# Patient Record
Sex: Female | Born: 1990 | Race: Black or African American | Hispanic: No | Marital: Single | Smoking: Never smoker
Health system: Southern US, Community
[De-identification: ages and names within clinical notes are randomized; demographics above are authoritative.]

## PROBLEM LIST (undated history)

## (undated) DIAGNOSIS — M546 Pain in thoracic spine: Secondary | ICD-10-CM

## (undated) DIAGNOSIS — F418 Other specified anxiety disorders: Secondary | ICD-10-CM

## (undated) DIAGNOSIS — E669 Obesity, unspecified: Secondary | ICD-10-CM

## (undated) DIAGNOSIS — Z3041 Encounter for surveillance of contraceptive pills: Secondary | ICD-10-CM

## (undated) DIAGNOSIS — E559 Vitamin D deficiency, unspecified: Secondary | ICD-10-CM

## (undated) DIAGNOSIS — T783XXA Angioneurotic edema, initial encounter: Secondary | ICD-10-CM

## (undated) DIAGNOSIS — R739 Hyperglycemia, unspecified: Secondary | ICD-10-CM

## (undated) DIAGNOSIS — L509 Urticaria, unspecified: Secondary | ICD-10-CM

## (undated) DIAGNOSIS — E781 Pure hyperglyceridemia: Secondary | ICD-10-CM

## (undated) DIAGNOSIS — K760 Fatty (change of) liver, not elsewhere classified: Secondary | ICD-10-CM

## (undated) DIAGNOSIS — D72829 Elevated white blood cell count, unspecified: Secondary | ICD-10-CM

## (undated) HISTORY — PX: WISDOM TOOTH EXTRACTION: SHX21

## (undated) HISTORY — DX: Angioneurotic edema, initial encounter: T78.3XXA

## (undated) HISTORY — DX: Hyperglycemia, unspecified: R73.9

## (undated) HISTORY — DX: Urticaria, unspecified: L50.9

## (undated) HISTORY — DX: Pain in thoracic spine: M54.6

## (undated) HISTORY — DX: Other specified anxiety disorders: F41.8

## (undated) HISTORY — DX: Fatty (change of) liver, not elsewhere classified: K76.0

## (undated) HISTORY — DX: Obesity, unspecified: E66.9

## (undated) HISTORY — DX: Elevated white blood cell count, unspecified: D72.829

## (undated) HISTORY — DX: Vitamin D deficiency, unspecified: E55.9

## (undated) HISTORY — DX: Pure hyperglyceridemia: E78.1

## (undated) HISTORY — DX: Encounter for surveillance of contraceptive pills: Z30.41

---
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
## 2011-07-18 ENCOUNTER — Emergency Department: Payer: Self-pay | Admitting: Unknown Physician Specialty

## 2011-08-02 ENCOUNTER — Ambulatory Visit: Payer: Self-pay | Admitting: General Surgery

## 2011-08-02 HISTORY — PX: CHOLECYSTECTOMY: SHX55

## 2012-04-26 IMAGING — US ABDOMEN ULTRASOUND
1 series · 17 of 25 positions shown · non-contrast
Comparison: none

REASON FOR EXAM: RUQ and epigastric pain for 5 days.  worse with food
COMMENTS:   LMP: Negative Beta HCG

[Series 1: abdomen ultrasound · 17 of 71 slices shown]
[im 1/71]
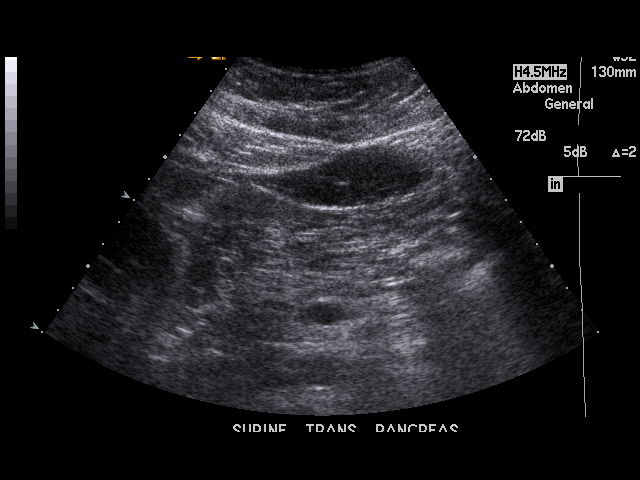
[im 6/71]
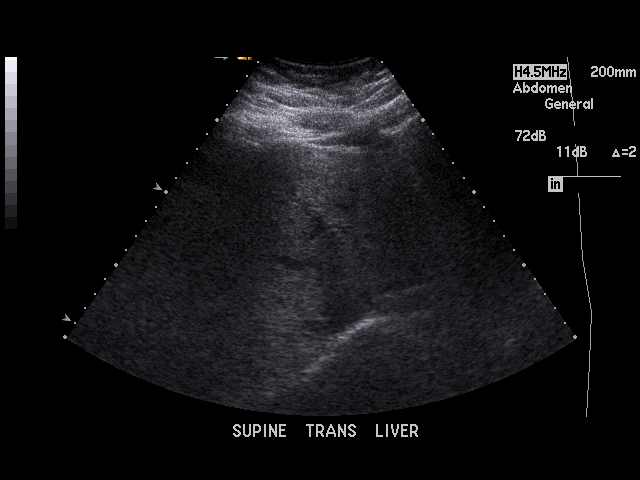
[im 9/71]
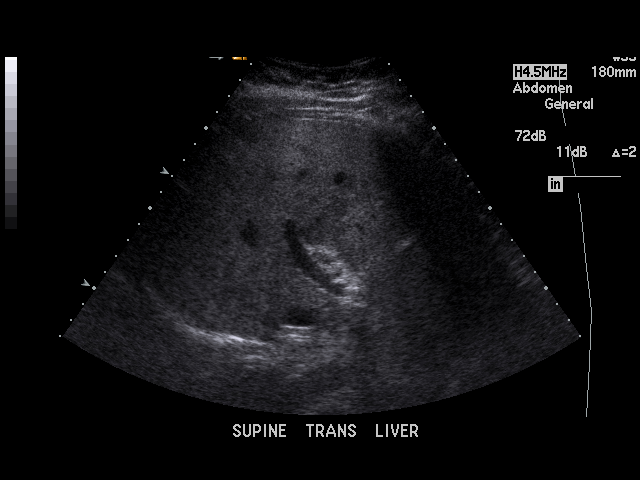
[im 15/71]
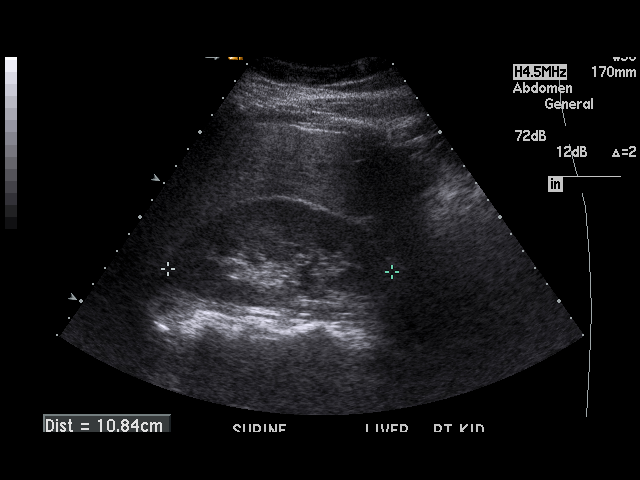
[im 18/71]
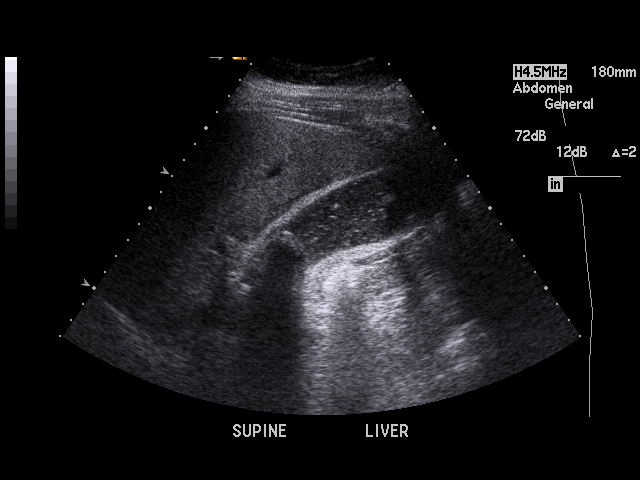
[im 24/71]
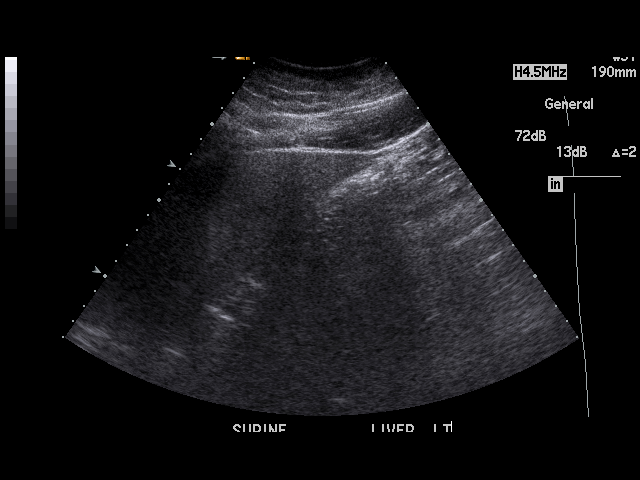
[im 27/71]
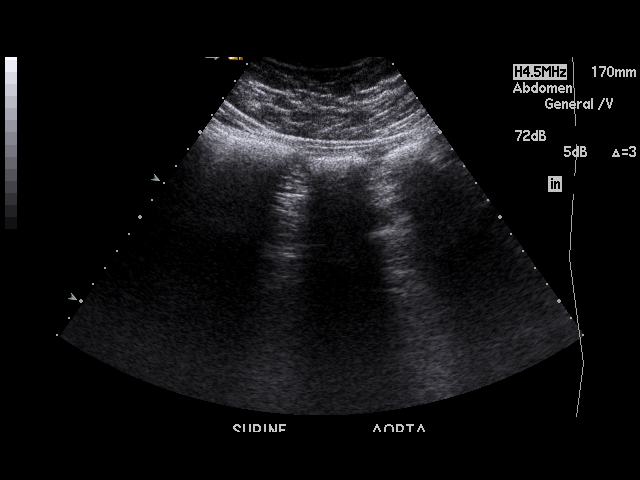
[im 33/71]
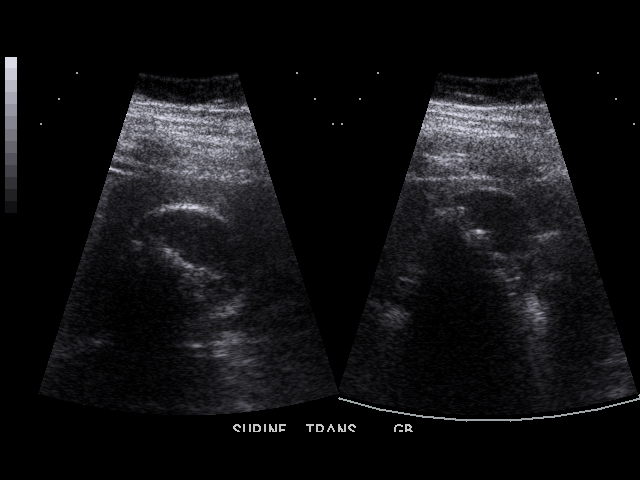
[im 36/71]
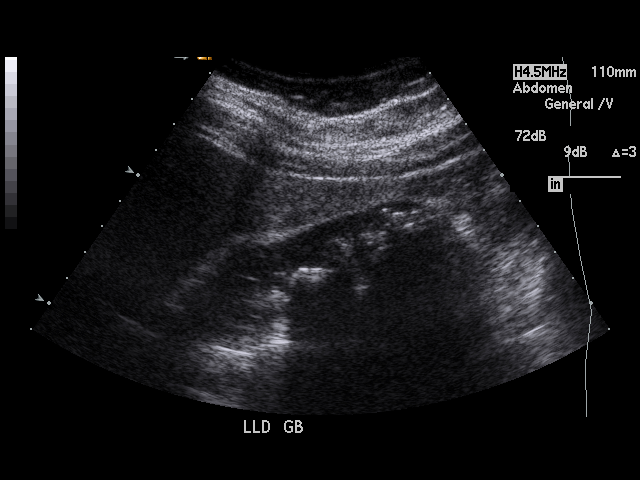
[im 38/71]
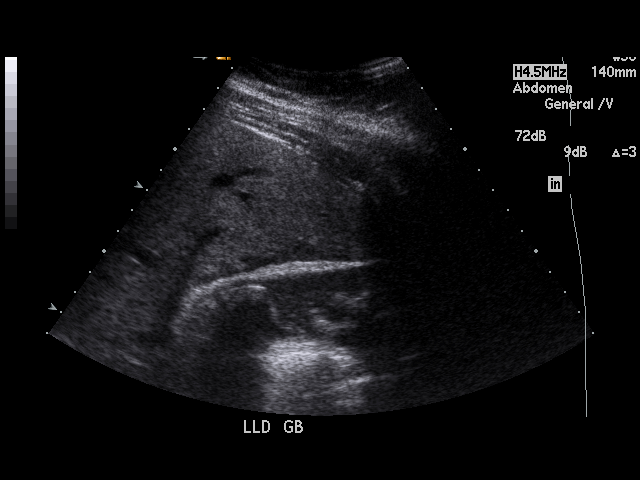
[im 44/71]
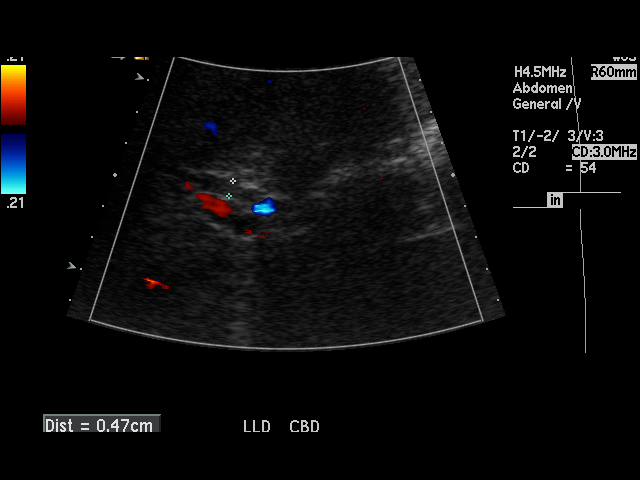
[im 47/71]
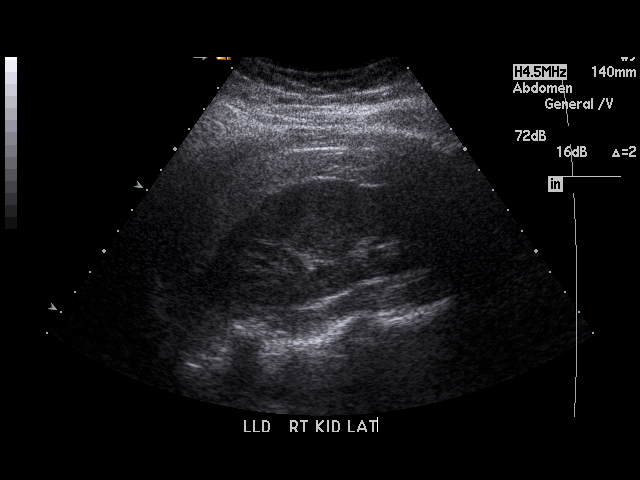
[im 53/71]
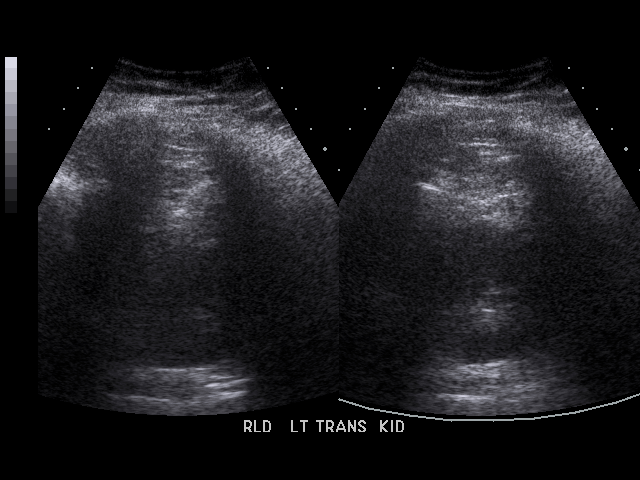
[im 56/71]
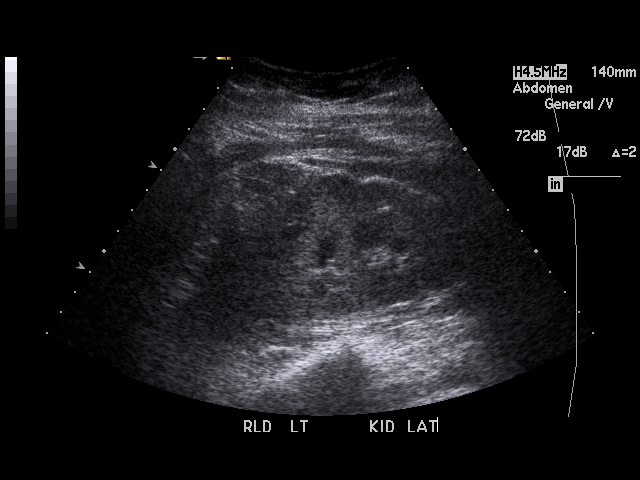
[im 62/71]
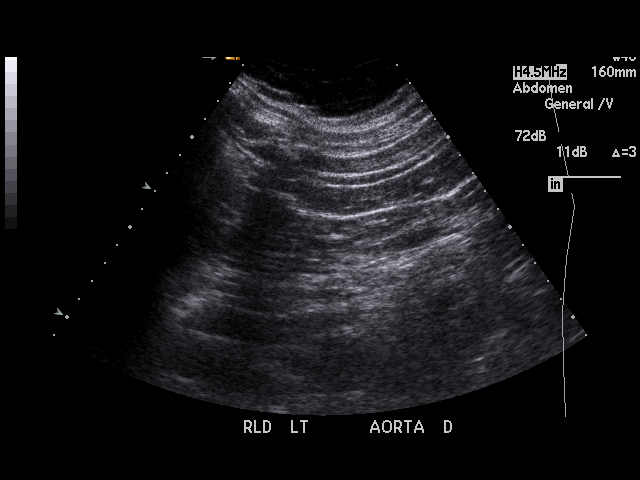
[im 65/71]
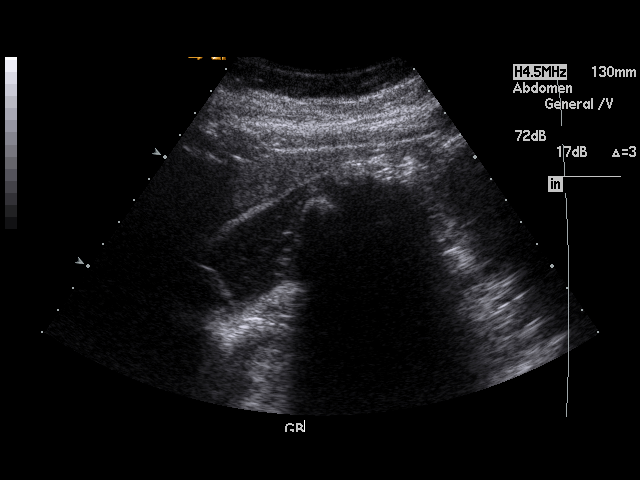
[im 71/71]
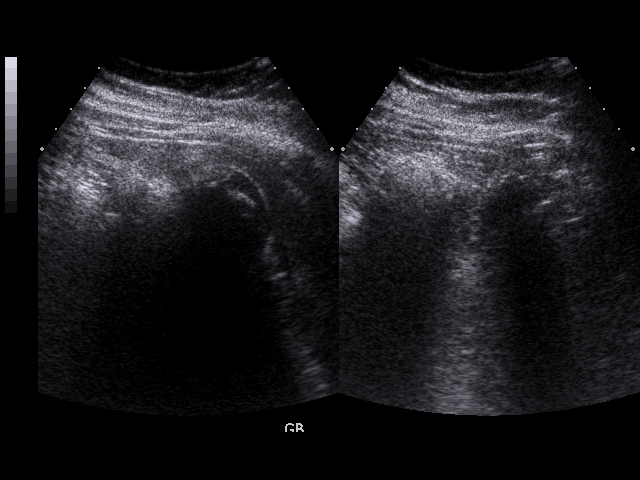

[17 of 25 positions shown; findings below may reference images not displayed]

PROCEDURE:     US  - US ABDOMEN GENERAL SURVEY  - July 18, 2011 [DATE]

RESULT:     The liver appears mildly hyperechogenic, suspicious for fatty
infiltration. The spleen size is normal. The abdominal aorta and inferior
vena cava show no significant abnormalities. Portions of the pancreatic head
and tail are obscured. The body of the pancreas appears normal
sonographically. There are echo densities in the gallbladder compatible with
gallstones. One of the stones measures 2.5 cm in diameter. There is
thickening of the gallbladder wall which measures 6.3 mm in thickness. No
definite pericholecystic fluid is seen. The kidneys show no hydronephrosis.
There is no ascites.
IMPRESSION: 1.  Cholelithiasis with there being thickening of the gallbladder wall
suspicious for cholecystitis.
2.  Possible fatty infiltration of the liver.
3.  No ascites is seen.

## 2013-05-08 ENCOUNTER — Emergency Department: Payer: Self-pay | Admitting: Emergency Medicine

## 2013-05-16 ENCOUNTER — Emergency Department: Payer: Self-pay | Admitting: Emergency Medicine

## 2013-07-09 LAB — HM PAP SMEAR: HM Pap smear: NORMAL

## 2014-01-02 LAB — HEMOGLOBIN A1C: HEMOGLOBIN A1C: 5.3
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
## 2014-05-28 HISTORY — PX: BREAST ENHANCEMENT SURGERY: SHX7

## 2014-07-10 ENCOUNTER — Emergency Department: Payer: Self-pay | Admitting: Emergency Medicine

## 2014-07-10 LAB — CK TOTAL AND CKMB (NOT AT ARMC): CK, Total: 62 U/L

## 2014-07-10 LAB — TROPONIN I: Troponin-I: <0.02 ng/mL

## 2014-07-10 LAB — CBC
HCT: 31.6 % — ABNORMAL LOW (ref 35.0–47.0)
HGB: 10.7 g/dL — AB (ref 12.0–16.0)
MCH: 32.2 pg (ref 26.0–34.0)
MCHC: 33.9 g/dL (ref 32.0–36.0)
MCV: 95 fL (ref 80–100)
PLATELETS: 249 10*3/uL (ref 150–440)
RBC: 3.32 10*6/uL — ABNORMAL LOW (ref 3.80–5.20)
RDW: 12.2 % (ref 11.5–14.5)
WBC: 14.2 10*3/uL — ABNORMAL HIGH (ref 3.6–11.0)

## 2014-07-10 LAB — URINALYSIS, COMPLETE
BILIRUBIN, UR: NEGATIVE
GLUCOSE, UR: NEGATIVE mg/dL (ref 0–75)
KETONE: NEGATIVE
Leukocyte Esterase: NEGATIVE
Nitrite: NEGATIVE
PH: 6 (ref 4.5–8.0)
Protein: NEGATIVE
RBC,UR: <1 /HPF (ref 0–5)
Specific Gravity: 1.006 (ref 1.003–1.030)
Squamous Epithelial: 1
WBC UR: 1 /HPF (ref 0–5)

## 2014-07-10 LAB — COMPREHENSIVE METABOLIC PANEL
Albumin: 3.2 g/dL — ABNORMAL LOW (ref 3.4–5.0)
Alkaline Phosphatase: 31 U/L — ABNORMAL LOW
Anion Gap: 3 — ABNORMAL LOW (ref 7–16)
BUN: 5 mg/dL — ABNORMAL LOW (ref 7–18)
Bilirubin,Total: 0.4 mg/dL (ref 0.2–1.0)
CHLORIDE: 110 mmol/L — AB (ref 98–107)
Calcium, Total: 8.7 mg/dL (ref 8.5–10.1)
Co2: 26 mmol/L (ref 21–32)
Creatinine: 0.91 mg/dL (ref 0.60–1.30)
EGFR (African American): >60
EGFR (Non-African Amer.): >60
GLUCOSE: 115 mg/dL — AB (ref 65–99)
Osmolality: 276 (ref 275–301)
Potassium: 3.5 mmol/L (ref 3.5–5.1)
SGOT(AST): 39 U/L — ABNORMAL HIGH (ref 15–37)
SGPT (ALT): 64 U/L (ref 12–78)
SODIUM: 139 mmol/L (ref 136–145)
TOTAL PROTEIN: 6.5 g/dL (ref 6.4–8.2)

## 2014-07-10 LAB — D-DIMER(ARMC): D-Dimer: 790 ng/ml

## 2014-07-10 LAB — PROTIME-INR
INR: 0.9
Prothrombin Time: 12.4 secs (ref 11.5–14.7)

## 2014-07-10 LAB — APTT: ACTIVATED PTT: 26.4 s (ref 23.6–35.9)

## 2014-09-17 DIAGNOSIS — E669 Obesity, unspecified: Secondary | ICD-10-CM | POA: Insufficient documentation

## 2014-10-07 LAB — LIPID PANEL
CHOLESTEROL: 215 mg/dL — AB (ref 0–200)
HDL: 71 mg/dL — AB (ref 35–70)
LDL CALC: 120 mg/dL
Triglycerides: 118 mg/dL (ref 40–160)

## 2015-04-19 IMAGING — CR DG CHEST 2V
1 series · 2 of 2 positions shown · non-contrast
Comparison: None.

CLINICAL DATA: Tachycardia and fever after breast reduction
surgery. Palpitations.

EXAM:
CHEST  2 VIEW

[Series 1: w chest pa · 0.14mm/px · 2 of 2 slices shown]
[im 1/2]
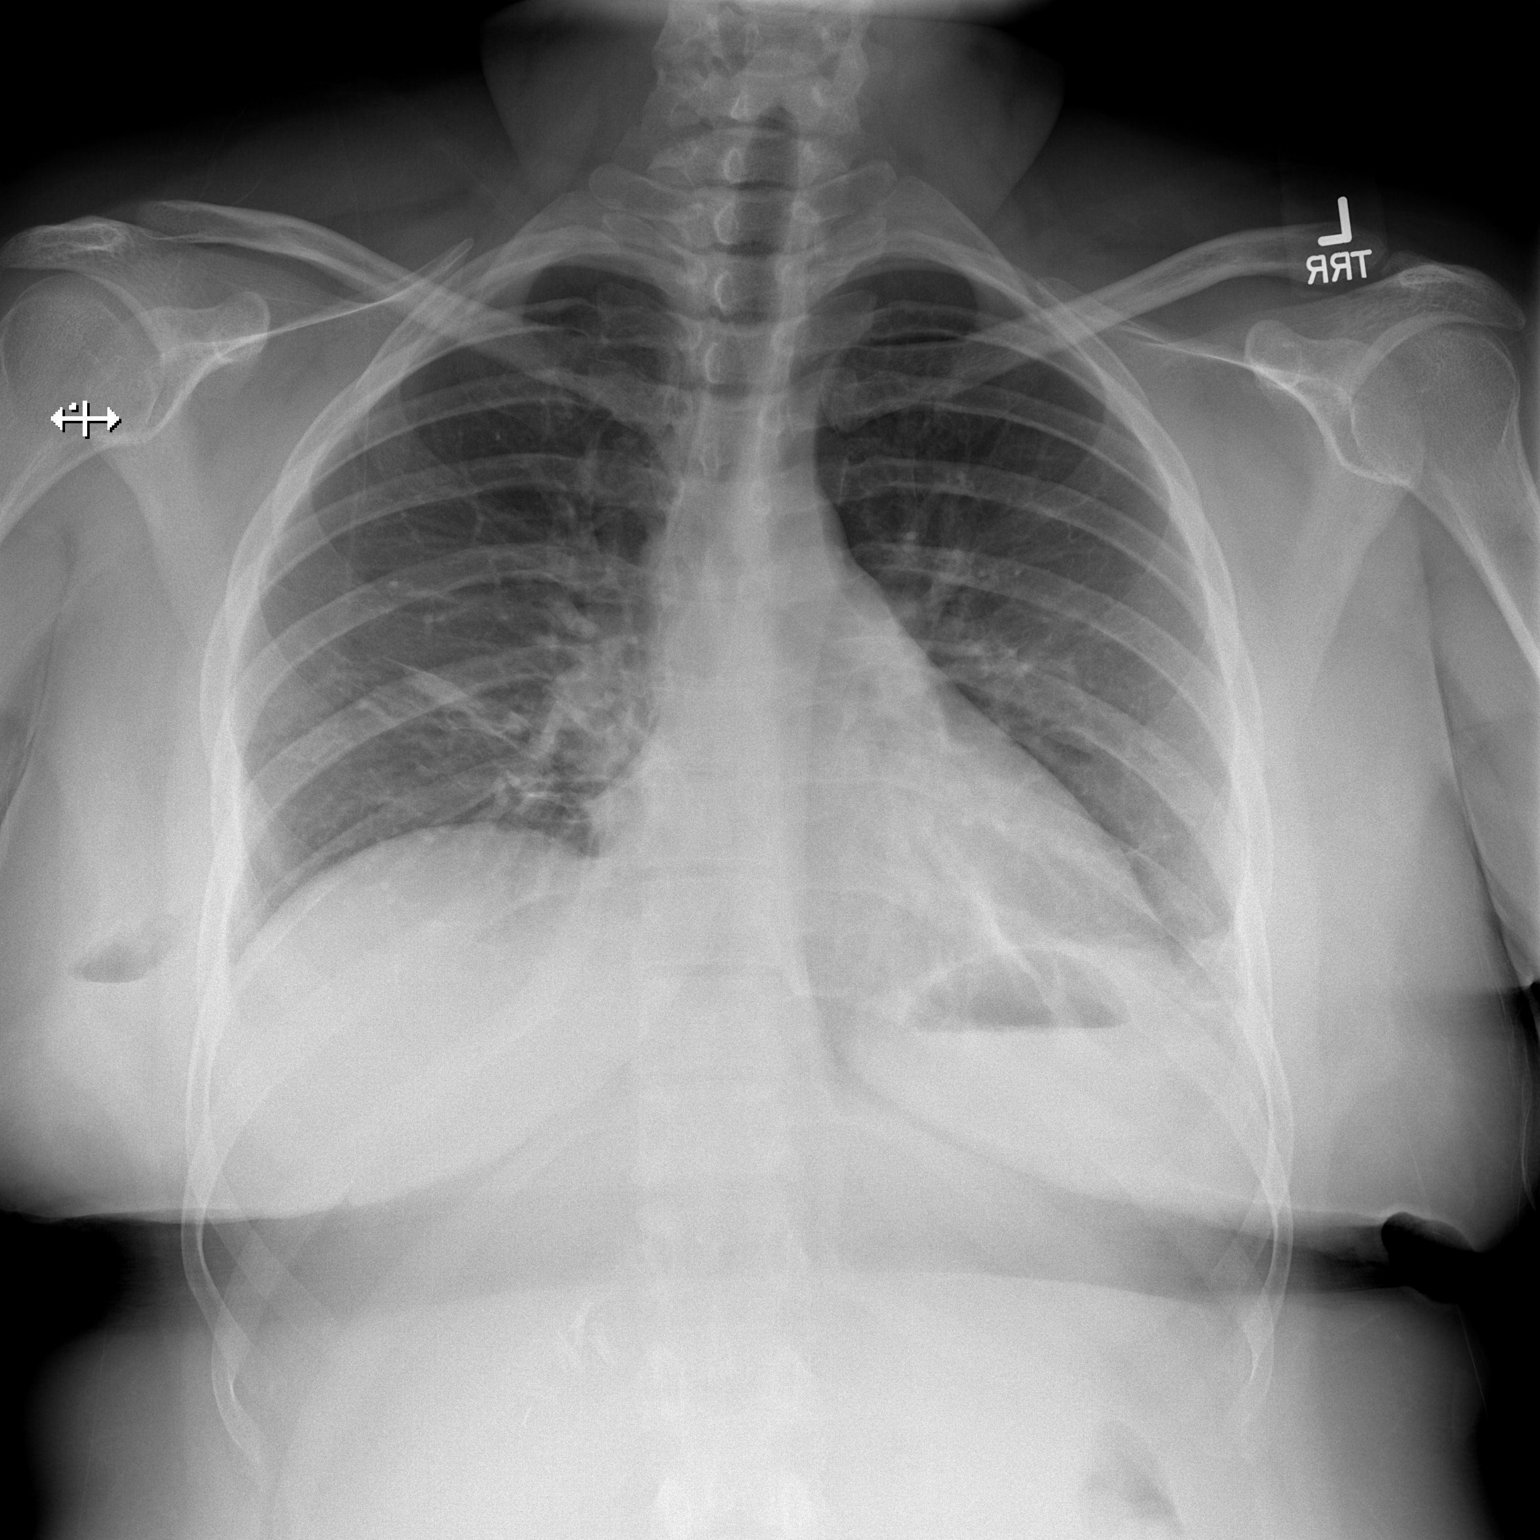
[im 2/2]
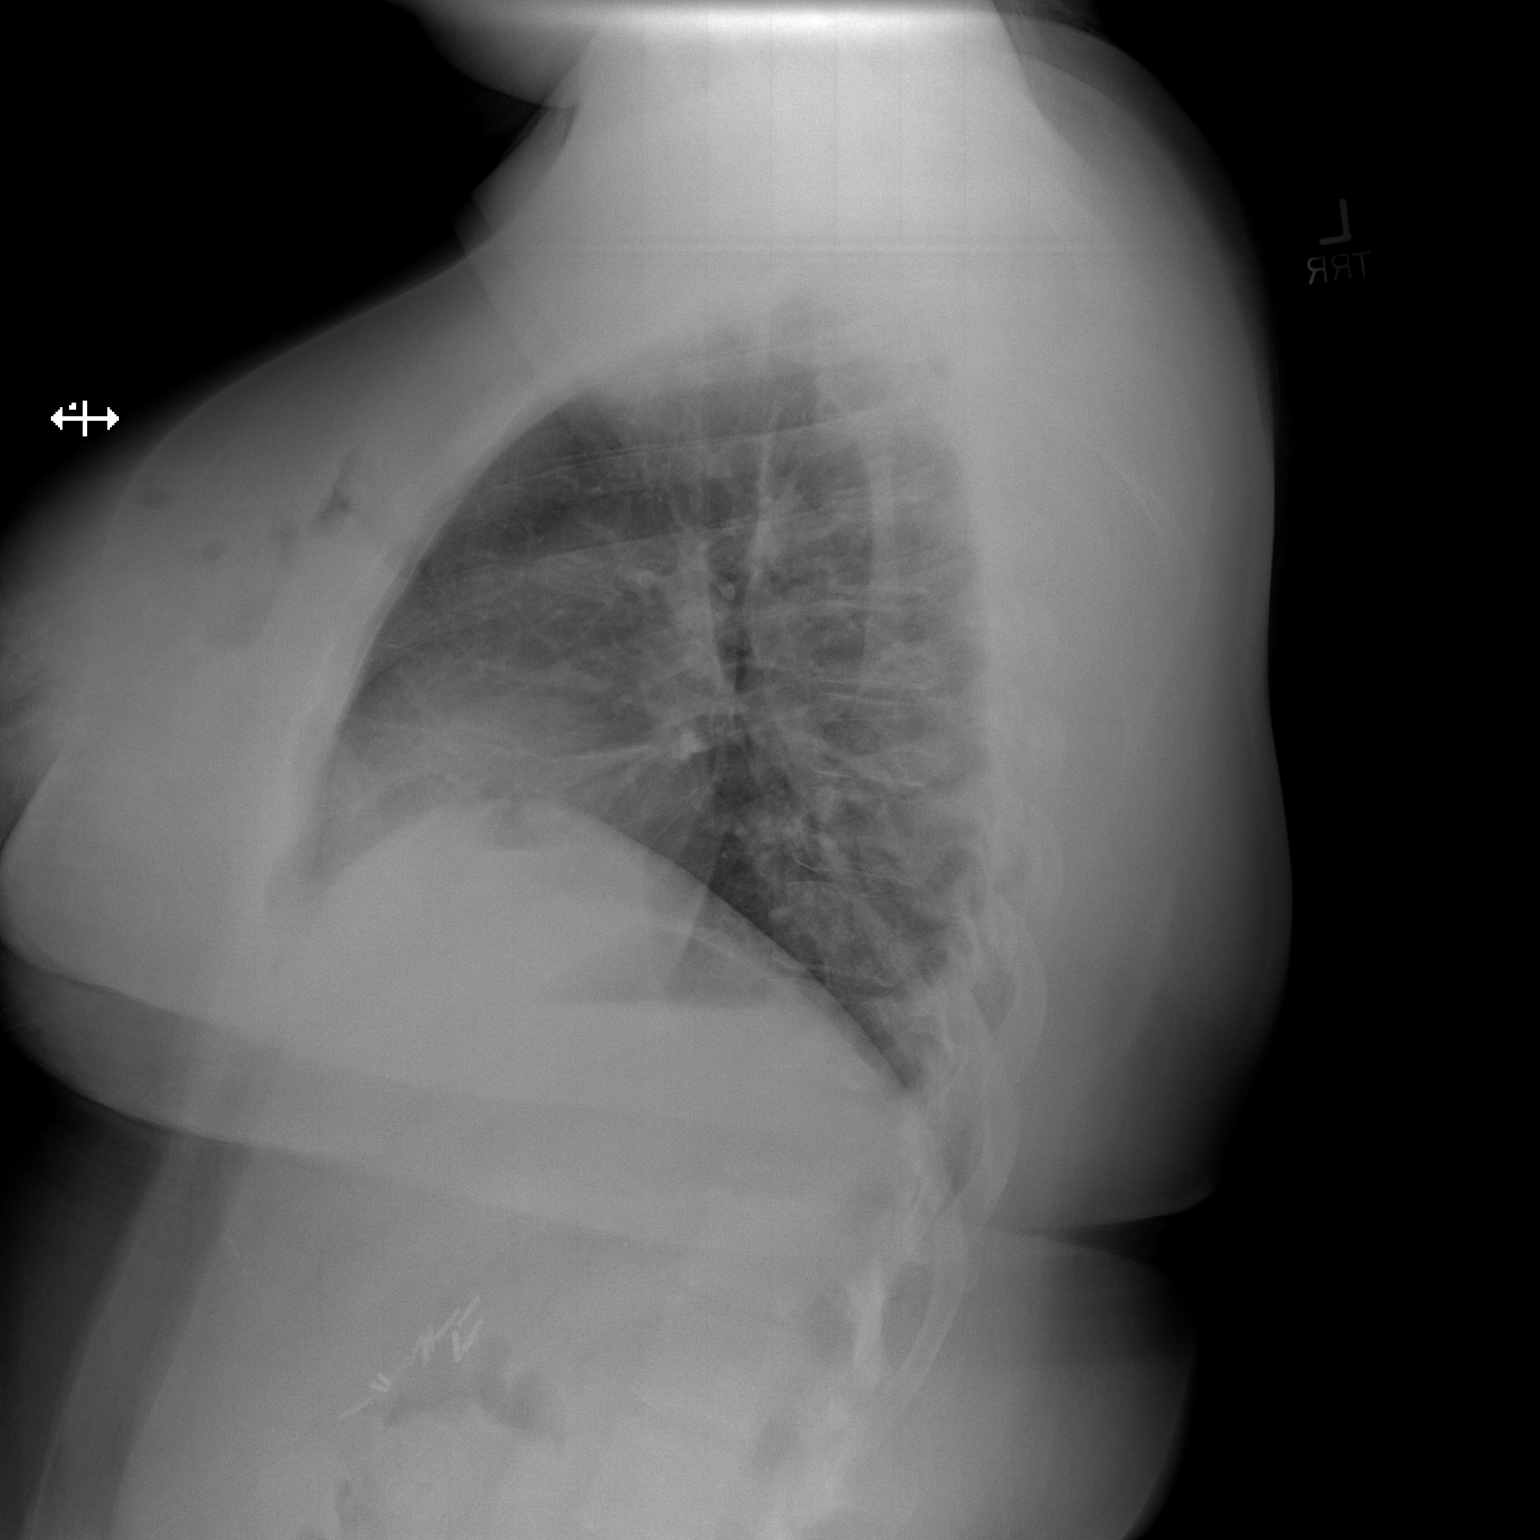

[2 of 2 positions shown; findings below may reference images not displayed]

FINDINGS: The lungs are well-aerated. Mild bilateral linear atelectasis is
noted. There is no evidence of pleural effusion or pneumothorax.

The heart is normal in size; the mediastinal contour is within
normal limits. No acute osseous abnormalities are seen. Clips are
noted within the right upper quadrant, reflecting prior
cholecystectomy.

Note is made of an air-fluid level within the right breast. If the
patient's symptoms persist, breast ultrasound and correlation with
lab values would be helpful for further evaluation.
IMPRESSION: 1. Air-fluid level noted within the right breast. If the patient's
symptoms persist, breast ultrasound and correlation with lab values
would be helpful for further evaluation.
2. Mild bilateral linear atelectasis noted.

## 2015-12-24 ENCOUNTER — Encounter: Payer: Self-pay | Admitting: Family Medicine

## 2015-12-24 ENCOUNTER — Ambulatory Visit (INDEPENDENT_AMBULATORY_CARE_PROVIDER_SITE_OTHER): Payer: PRIVATE HEALTH INSURANCE | Admitting: Family Medicine

## 2015-12-24 VITALS — BP 108/64 | HR 86 | Temp 97.9°F | Resp 16 | Ht 68.0 in | Wt 225.0 lb

## 2015-12-24 DIAGNOSIS — Z113 Encounter for screening for infections with a predominantly sexual mode of transmission: Secondary | ICD-10-CM | POA: Diagnosis not present

## 2015-12-24 DIAGNOSIS — E781 Pure hyperglyceridemia: Secondary | ICD-10-CM | POA: Diagnosis not present

## 2015-12-24 DIAGNOSIS — F418 Other specified anxiety disorders: Secondary | ICD-10-CM | POA: Insufficient documentation

## 2015-12-24 DIAGNOSIS — K760 Fatty (change of) liver, not elsewhere classified: Secondary | ICD-10-CM | POA: Insufficient documentation

## 2015-12-24 DIAGNOSIS — N920 Excessive and frequent menstruation with regular cycle: Secondary | ICD-10-CM | POA: Insufficient documentation

## 2015-12-24 DIAGNOSIS — L509 Urticaria, unspecified: Secondary | ICD-10-CM | POA: Insufficient documentation

## 2015-12-24 DIAGNOSIS — E559 Vitamin D deficiency, unspecified: Secondary | ICD-10-CM | POA: Diagnosis not present

## 2015-12-24 DIAGNOSIS — T783XXA Angioneurotic edema, initial encounter: Secondary | ICD-10-CM | POA: Insufficient documentation

## 2015-12-24 DIAGNOSIS — R739 Hyperglycemia, unspecified: Secondary | ICD-10-CM | POA: Diagnosis not present

## 2015-12-24 DIAGNOSIS — D72829 Elevated white blood cell count, unspecified: Secondary | ICD-10-CM

## 2015-12-24 DIAGNOSIS — Z0000 Encounter for general adult medical examination without abnormal findings: Secondary | ICD-10-CM

## 2015-12-24 DIAGNOSIS — Z01419 Encounter for gynecological examination (general) (routine) without abnormal findings: Secondary | ICD-10-CM

## 2015-12-24 NOTE — Progress Notes (Signed)
Name: Vicki Arnold   MRN: [id number]    DOB: 20-Aug-1991   Date:12/24/2015       Progress Note  Subjective  Chief Complaint  Chief Complaint  Patient presents with  . Annual Exam    HPI  Well Woman: cycles are now regular but still lasts 7 days, heavy for the first couple of days.  She is currently on her menses. Never sexually active. 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 No complaints today   Patient Active Problem List   Diagnosis Date Noted  . Angio-edema 12/24/2015  . Fatty infiltration of liver 12/24/2015  . Hive 12/24/2015  . Hypertriglyceridemia 12/24/2015  . Blood glucose elevated 12/24/2015  . Leukocytosis 12/24/2015  . Vitamin D deficiency 12/24/2015  . Performance anxiety 12/24/2015  . Obesity (BMI 30.0-34.9) 09/17/2014    Past Surgical History  Procedure Laterality Date  . Cholecystectomy  08/02/2011    Lap-Dr. Fleet Contras  . Wisdom tooth extraction    . Breast enhancement surgery  05/28/2014    Family History  Problem Relation Age of Onset  . Diabetes Mother     Pre-Diabetic  . Hypertension Father     Social History   Social History  . Marital Status: Single    Spouse Name: N/A  . Number of Children: N/A  . Years of Education: N/A   Occupational History  . Not on file.   Social History Main Topics  . Smoking status: Never Smoker   . Smokeless tobacco: Never Used  . Alcohol Use: No  . Drug Use: No  . Sexual Activity: Not Currently   Other Topics Concern  . Not on file   Social History Narrative     Current outpatient prescriptions:  .  diphenhydrAMINE (BENADRYL) 25 mg capsule, Take by mouth as needed., Disp: , Rfl:  .  hydrOXYzine (ATARAX/VISTARIL) 25 MG tablet, Take by mouth., Disp: , Rfl:  .  levocetirizine (XYZAL) 5 MG tablet, Take by mouth., Disp: , Rfl:  .  montelukast (SINGULAIR) 10 MG tablet, Take by mouth., Disp: , Rfl:   Allergies  Allergen Reactions  . Penicillins Hives      ROS  Constitutional: Negative for fever or weight change.  Respiratory: Negative for cough and shortness of breath.   Cardiovascular: Negative for chest pain or palpitations.  Gastrointestinal: Negative for abdominal pain, no bowel changes.  Musculoskeletal: Negative for gait problem or joint swelling.  Skin: Negative for rash.  Neurological: Negative for dizziness or headache.  No other specific complaints in a complete review of systems (except as listed in HPI above).   Objective  Filed Vitals:   12/24/15 1201  BP: 108/64  Pulse: 86  Temp: 97.9 F (36.6 C)  TempSrc: Oral  Resp: 16  Height: 5\' 8"  (1.727 m)  Weight: 225 lb (102.059 kg)  SpO2: 97%    Body mass index is 34.22 kg/(m^2).  Physical Exam  Constitutional: Patient appears well-developed and well-nourished. No distress.  HENT: Head: Normocephalic and atraumatic. Ears: B TMs ok, no erythema or effusion; Nose: Nose normal. Mouth/Throat: Oropharynx is clear and moist. No oropharyngeal exudate.  Eyes: Conjunctivae and EOM are normal. Pupils are equal, round, and reactive to light. No scleral icterus.  Neck: Normal range of motion. Neck supple. No JVD present. No thyromegaly present.  Cardiovascular: Normal rate, regular rhythm and normal heart sounds.  No murmur heard. No BLE edema. Pulmonary/Chest: Effort normal and breath sounds normal. No respiratory  distress. Abdominal: Soft. Bowel sounds are normal, no distension. There is no tenderness. no masses Breast: no lumps or masses, no nipple discharge or rashes FEMALE GENITALIA:  Not done - on her cycle RECTAL: not done Musculoskeletal: Normal range of motion, no joint effusions. No gross deformities Neurological: he is alert and oriented to person, place, and time. No cranial nerve deficit. Coordination, balance, strength, speech and gait are normal.  Skin: Skin is warm and dry. No rash noted. No erythema.  Psychiatric: Patient has a normal mood and affect.  behavior is normal. Judgment and thought content normal.  PHQ2/9: Depression screen PHQ 2/9 12/24/2015  Decreased Interest 0  Down, Depressed, Hopeless 0  PHQ - 2 Score 0     Fall Risk: Fall Risk  12/24/2015  Falls in the past year? No     Functional Status Survey: Is the patient deaf or have difficulty hearing?: No Does the patient have difficulty seeing, even when wearing glasses/contacts?: No Does the patient have difficulty concentrating, remembering, or making decisions?: No Does the patient have difficulty walking or climbing stairs?: No Does the patient have difficulty dressing or bathing?: No Does the patient have difficulty doing errands alone such as visiting a doctor's office or shopping?: No  Assessment & Plan  1. Well woman exam  Discussed importance of 150 minutes of physical activity weekly, eat two servings of fish weekly, eat one serving of tree nuts ( cashews, pistachios, pecans, almonds.Marland Kitchen) every other day, eat 6 servings of fruit/vegetables daily and drink plenty of water and avoid sweet beverages.  She had a Tdap at work, we will obtain a copy of her shot records - Comprehensive metabolic panel - Vitamin 123456 - TSH  2. Vitamin D deficiency  - VITAMIN D 25 Hydroxy (Vit-D Deficiency, Fractures)  3. Leukocytosis  - CBC with Differential/Platelet  4. Blood glucose elevated  - Hemoglobin A1c  5. Hypertriglyceridemia  - Lipid panel  6. Routine screening for STI (sexually transmitted infection)  - HIV antibody - RPR - Hepatitis, Acute

## 2016-01-06 ENCOUNTER — Other Ambulatory Visit: Payer: Self-pay | Admitting: Family Medicine

## 2016-01-06 DIAGNOSIS — D473 Essential (hemorrhagic) thrombocythemia: Secondary | ICD-10-CM

## 2016-01-06 DIAGNOSIS — D75839 Thrombocytosis, unspecified: Secondary | ICD-10-CM

## 2016-01-06 LAB — CBC WITH DIFFERENTIAL/PLATELET
Basophils Absolute: 0 10*3/uL (ref 0.0–0.2)
Basos: 1 %
EOS (ABSOLUTE): 0.1 10*3/uL (ref 0.0–0.4)
Eos: 2 %
HEMATOCRIT: 42 % (ref 34.0–46.6)
Hemoglobin: 14.2 g/dL (ref 11.1–15.9)
Immature Grans (Abs): 0 10*3/uL (ref 0.0–0.1)
Immature Granulocytes: 0 %
LYMPHS ABS: 5 10*3/uL — AB (ref 0.7–3.1)
Lymphs: 60 %
MCH: 32.3 pg (ref 26.6–33.0)
MCHC: 33.8 g/dL (ref 31.5–35.7)
MCV: 96 fL (ref 79–97)
MONOS ABS: 0.3 10*3/uL (ref 0.1–0.9)
Monocytes: 4 %
NEUTROS ABS: 2.8 10*3/uL (ref 1.4–7.0)
Neutrophils: 33 %
Platelets: 388 10*3/uL — ABNORMAL HIGH (ref 150–379)
RBC: 4.4 x10E6/uL (ref 3.77–5.28)
RDW: 12.3 % (ref 12.3–15.4)
WBC: 8.3 10*3/uL (ref 3.4–10.8)

## 2016-01-06 LAB — VITAMIN D 25 HYDROXY (VIT D DEFICIENCY, FRACTURES): Vit D, 25-Hydroxy: 13.9 ng/mL — ABNORMAL LOW (ref 30.0–100.0)

## 2016-01-06 LAB — COMPREHENSIVE METABOLIC PANEL
ALBUMIN: 4.3 g/dL (ref 3.5–5.5)
ALK PHOS: 39 IU/L (ref 39–117)
ALT: 25 IU/L (ref 0–32)
AST: 15 IU/L (ref 0–40)
Albumin/Globulin Ratio: 1.7 (ref 1.1–2.5)
BILIRUBIN TOTAL: 0.3 mg/dL (ref 0.0–1.2)
BUN / CREAT RATIO: 7 — AB (ref 8–20)
BUN: 5 mg/dL — AB (ref 6–20)
CO2: 20 mmol/L (ref 18–29)
CREATININE: 0.67 mg/dL (ref 0.57–1.00)
Calcium: 9.5 mg/dL (ref 8.7–10.2)
Chloride: 104 mmol/L (ref 96–106)
GFR calc non Af Amer: 123 mL/min/{1.73_m2} (ref >59)
GFR, EST AFRICAN AMERICAN: 142 mL/min/{1.73_m2} (ref >59)
GLOBULIN, TOTAL: 2.5 g/dL (ref 1.5–4.5)
GLUCOSE: 95 mg/dL (ref 65–99)
Potassium: 4.7 mmol/L (ref 3.5–5.2)
SODIUM: 141 mmol/L (ref 134–144)
TOTAL PROTEIN: 6.8 g/dL (ref 6.0–8.5)

## 2016-01-06 LAB — TSH: TSH: 2.77 u[IU]/mL (ref 0.450–4.500)

## 2016-01-06 LAB — HEMOGLOBIN A1C
Est. average glucose Bld gHb Est-mCnc: 108 mg/dL
Hgb A1c MFr Bld: 5.4 % (ref 4.8–5.6)

## 2016-01-06 LAB — HIV ANTIBODY (ROUTINE TESTING W REFLEX): HIV SCREEN 4TH GENERATION: NONREACTIVE

## 2016-01-06 LAB — RPR: RPR Ser Ql: NONREACTIVE

## 2016-01-06 LAB — HEPATITIS PANEL, ACUTE
HEP A IGM: NEGATIVE
HEP B C IGM: NEGATIVE
HEP B S AG: NEGATIVE
Hep C Virus Ab: <0.1 s/co ratio (ref 0.0–0.9)

## 2016-01-06 LAB — LIPID PANEL
CHOLESTEROL TOTAL: 185 mg/dL (ref 100–199)
Chol/HDL Ratio: 3.2 ratio units (ref 0.0–4.4)
HDL: 58 mg/dL (ref >39)
LDL Calculated: 109 mg/dL — ABNORMAL HIGH (ref 0–99)
Triglycerides: 89 mg/dL (ref 0–149)
VLDL CHOLESTEROL CAL: 18 mg/dL (ref 5–40)

## 2016-01-06 LAB — VITAMIN B12: Vitamin B-12: 336 pg/mL (ref 211–946)

## 2016-01-06 MED ORDER — VITAMIN D (ERGOCALCIFEROL) 1.25 MG (50000 UNIT) PO CAPS: 50000.0000 [IU] | ORAL_CAPSULE | ORAL | Status: DC

## 2016-01-07 ENCOUNTER — Telehealth: Payer: Self-pay

## 2016-01-07 NOTE — Telephone Encounter (Signed)
Patient returned my call regarding her lab results. After she verified her date of birth, results were reviewed and she was given Dr. Ancil Boozer instructions on taking rx vitamin D.

## 2016-06-14 ENCOUNTER — Telehealth: Payer: Self-pay | Admitting: Family Medicine

## 2016-06-14 DIAGNOSIS — Z111 Encounter for screening for respiratory tuberculosis: Secondary | ICD-10-CM

## 2016-06-14 NOTE — Telephone Encounter (Signed)
Done , she can get it done in our office

## 2016-06-14 NOTE — Telephone Encounter (Signed)
I assume she is referring to the Quantiferon Gold test.

## 2016-06-14 NOTE — Addendum Note (Signed)
Addended by: Johnnette Litter A on: 06/14/2016 04:34 PM   Modules accepted: Orders

## 2016-06-14 NOTE — Telephone Encounter (Signed)
Please call patient

## 2016-06-14 NOTE — Telephone Encounter (Signed)
Pt is asking for a blood test but asking for the blood test on the Q WORD FOR IT. THIS IS WORK RELATED AND THEY ARE ASKING FOR THIS

## 2017-02-06 ENCOUNTER — Encounter: Payer: Self-pay | Admitting: Family Medicine

## 2017-02-06 ENCOUNTER — Ambulatory Visit (INDEPENDENT_AMBULATORY_CARE_PROVIDER_SITE_OTHER): Payer: 59 | Admitting: Family Medicine

## 2017-02-06 ENCOUNTER — Telehealth: Payer: Self-pay

## 2017-02-06 VITALS — BP 108/64 | HR 112 | Temp 97.7°F | Resp 16 | Ht 67.5 in | Wt 187.9 lb

## 2017-02-06 DIAGNOSIS — Z124 Encounter for screening for malignant neoplasm of cervix: Secondary | ICD-10-CM

## 2017-02-06 DIAGNOSIS — Z01419 Encounter for gynecological examination (general) (routine) without abnormal findings: Secondary | ICD-10-CM | POA: Diagnosis not present

## 2017-02-06 DIAGNOSIS — Z30011 Encounter for initial prescription of contraceptive pills: Secondary | ICD-10-CM | POA: Diagnosis not present

## 2017-02-06 MED ORDER — DROSPIRENONE-ETHINYL ESTRADIOL 3-0.03 MG PO TABS: 1.0000 | ORAL_TABLET | Freq: Every day | ORAL | 4 refills | Status: DC

## 2017-02-06 NOTE — Telephone Encounter (Signed)
Patient called and asked if we could please add on a whole Hepatitis Screening Panel. 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

## 2017-02-06 NOTE — Progress Notes (Signed)
Name: Vicki Arnold   MRN: [id number]    DOB: [birth year]   Date:02/06/2017       Progress Note  Subjective  Chief Complaint  Chief Complaint  Patient presents with  . Annual Exam    HPI  Well Woman: she has been sexually active, she was seeing a guy in [place], but is now going to work in [place] . She would like to resume [place], she did not have side effects of medication in the past. She has changed her life style and has lost a lot of weight since last visit. She has some fatigue, she works third shift as a Marine scientist. She denies vaginal discharge or urinary symptoms.    Patient Active Problem List   Diagnosis Date Noted  . Thrombocytosis ([place]) 01/06/2016  . Angio-edema 12/24/2015  . Fatty infiltration of liver 12/24/2015  . Hive 12/24/2015  . Hypertriglyceridemia 12/24/2015  . Blood glucose elevated 12/24/2015  . Vitamin D deficiency 12/24/2015  . Performance anxiety 12/24/2015  . Obesity (BMI 30.0-34.9) 09/17/2014    Past Surgical History:  Procedure Laterality Date  . BREAST ENHANCEMENT SURGERY Bilateral 05/28/2014   Reduction  . CHOLECYSTECTOMY  08/02/2011   Lap-Dr. Fleet Contras  . WISDOM TOOTH EXTRACTION      Family History  Problem Relation Age of Onset  . Diabetes Mother     Pre-Diabetic  . Hypertension Mother   . Hypertension Father     Social History   Social History  . Marital status: Single    Spouse name: N/A  . Number of children: N/A  . Years of education: N/A   Occupational History  . Not on file.   Social History Main Topics  . Smoking status: Never Smoker  . Smokeless tobacco: Never Used  . Alcohol use 0.0 oz/week     Comment: occasionally  . Drug use: No  . Sexual activity: Yes    Partners: Male    Birth control/ protection: Condom   Other Topics Concern  . Not on file   Social History Narrative  . No narrative on file     Current Outpatient Prescriptions:  .  diphenhydrAMINE (BENADRYL) 25 mg capsule, Take by mouth as needed., Disp: , Rfl:    Allergies  Allergen Reactions  . Penicillins Hives     ROS  Constitutional: Negative for fever, positive for  weight change.  Respiratory: Negative for cough and shortness of breath.   Cardiovascular: Negative for chest pain or palpitations.  Gastrointestinal: Negative for abdominal pain, no bowel changes.  Musculoskeletal: Negative for gait problem or joint swelling.  Skin: Negative for rash.  Neurological: Negative for dizziness or headache.  No other specific complaints in a complete review of systems (except as listed in HPI above).  Objective  Vitals:   02/06/17 1109  BP: 108/64  Pulse: (!) 112  Resp: 16  Temp: 97.7 F (36.5 C)  TempSrc: Oral  SpO2: 98%  Weight: 187 lb 14.4 oz (85.2 kg)  Height: 5' 7.5" (1.715 m)    Body mass index is 28.99 kg/m.  Physical Exam  Constitutional: Patient appears well-developed and well-nourished. No distress.  HENT: Head: Normocephalic and atraumatic. Ears: B TMs ok, no erythema or effusion; Nose: Nose normal. Mouth/Throat: Oropharynx is clear and moist. No oropharyngeal exudate.  Eyes: Conjunctivae and EOM are normal. Pupils are equal, round, and reactive to light. No scleral icterus.  Neck: Normal range of motion. Neck supple. No JVD present. No thyromegaly present.  Cardiovascular: Normal rate, regular  rhythm and normal heart sounds.  No murmur heard. No BLE edema. Pulmonary/Chest: Effort normal and breath sounds normal. No respiratory distress. Abdominal: Soft. Bowel sounds are normal, no distension. There is no tenderness. no masses Breast: lumpy breast, no masses, no nipple discharge or rashes, keloid from breast reduction surgery  FEMALE GENITALIA:  External genitalia normal External urethra normal Vaginal vault normal without discharge or lesions Cervix normal without discharge or lesions Bimanual exam normal without masses RECTAL: not done Musculoskeletal: Normal range of motion, no joint effusions. No gross  deformities Neurological: he is alert and oriented to person, place, and time. No cranial nerve deficit. Coordination, balance, strength, speech and gait are normal.  Skin: Skin is warm and dry. No rash noted. No erythema.  Psychiatric: Patient has a normal mood and affect. behavior is normal. Judgment and thought content normal.  PHQ2/9: Depression screen [facility] 2/9 02/06/2017 12/24/2015  Decreased Interest 0 0  Down, Depressed, Hopeless 0 0  PHQ - 2 Score 0 0     Fall Risk: Fall Risk  02/06/2017 12/24/2015  Falls in the past year? No No     Functional Status Survey: Is the patient deaf or have difficulty hearing?: No Does the patient have difficulty seeing, even when wearing glasses/contacts?: No Does the patient have difficulty concentrating, remembering, or making decisions?: No Does the patient have difficulty walking or climbing stairs?: No Does the patient have difficulty dressing or bathing?: No Does the patient have difficulty doing errands alone such as visiting a doctor's office or shopping?: No    Assessment & Plan  1. Well woman exam  Discussed importance of 150 minutes of physical activity weekly, eat two servings of fish weekly, eat one serving of tree nuts ( cashews, pistachios, pecans, almonds.Marland Kitchen) every other day, eat 6 servings of fruit/vegetables daily and drink plenty of water and avoid sweet beverages. She has changed her life style and has lost over 30 lbs over the past year. She stopped drinking sodas and avoid desserts.  - Lipid panel - CBC with Differential/Platelet - Comprehensive metabolic panel - Hemoglobin A1c - TSH - Vitamin B12 - VITAMIN D 25 Hydroxy (Vit-D Deficiency, Fractures) - RPR - HIV antibody  2. Cervical cancer screening  - Pap IG and Chlamydia/Gonococcus, NAA  3. Encounter for initial prescription of contraceptive pills  - drospirenone-ethinyl estradiol (OCELLA) 3-0.03 MG tablet; Take 1 tablet by mouth daily.  Dispense: 3 Package;  Refill: 4

## 2017-02-07 ENCOUNTER — Other Ambulatory Visit: Payer: Self-pay | Admitting: Family Medicine

## 2017-02-07 DIAGNOSIS — Z7721 Contact with and (suspected) exposure to potentially hazardous body fluids: Secondary | ICD-10-CM

## 2017-02-07 NOTE — Progress Notes (Signed)
Patient notified

## 2017-02-07 NOTE — Progress Notes (Unsigned)
Added order

## 2017-02-08 ENCOUNTER — Other Ambulatory Visit: Payer: Self-pay | Admitting: Family Medicine

## 2017-02-08 LAB — PAP IG AND CT-NG NAA
CHLAMYDIA, NUC. ACID AMP: NEGATIVE
Gonococcus by Nucleic Acid Amp: NEGATIVE
PAP Smear Comment: 0

## 2017-02-08 LAB — LIPID PANEL
CHOL/HDL RATIO: 3.1 ratio (ref 0.0–4.4)
Cholesterol, Total: 206 mg/dL — ABNORMAL HIGH (ref 100–199)
HDL: 66 mg/dL (ref >39)
LDL Calculated: 124 mg/dL — ABNORMAL HIGH (ref 0–99)
Triglycerides: 78 mg/dL (ref 0–149)
VLDL Cholesterol Cal: 16 mg/dL (ref 5–40)

## 2017-02-08 LAB — COMPREHENSIVE METABOLIC PANEL
ALBUMIN: 4.5 g/dL (ref 3.5–5.5)
ALK PHOS: 43 IU/L (ref 39–117)
ALT: 67 IU/L — AB (ref 0–32)
AST: 63 IU/L — ABNORMAL HIGH (ref 0–40)
Albumin/Globulin Ratio: 1.8 (ref 1.2–2.2)
BUN/Creatinine Ratio: 15 (ref 9–23)
BUN: 9 mg/dL (ref 6–20)
Bilirubin Total: 0.3 mg/dL (ref 0.0–1.2)
CHLORIDE: 102 mmol/L (ref 96–106)
CO2: 24 mmol/L (ref 18–29)
CREATININE: 0.59 mg/dL (ref 0.57–1.00)
Calcium: 9.4 mg/dL (ref 8.7–10.2)
GFR calc Af Amer: 147 mL/min/{1.73_m2} (ref >59)
GFR calc non Af Amer: 128 mL/min/{1.73_m2} (ref >59)
GLUCOSE: 85 mg/dL (ref 65–99)
Globulin, Total: 2.5 g/dL (ref 1.5–4.5)
Potassium: 4.6 mmol/L (ref 3.5–5.2)
Sodium: 141 mmol/L (ref 134–144)
Total Protein: 7 g/dL (ref 6.0–8.5)

## 2017-02-08 LAB — HEMOGLOBIN A1C
ESTIMATED AVERAGE GLUCOSE: 97 mg/dL
HEMOGLOBIN A1C: 5 % (ref 4.8–5.6)

## 2017-02-08 LAB — HIV ANTIBODY (ROUTINE TESTING W REFLEX): HIV Screen 4th Generation wRfx: NONREACTIVE

## 2017-02-08 LAB — RPR: RPR: NONREACTIVE

## 2017-02-08 LAB — CBC WITH DIFFERENTIAL/PLATELET
BASOS ABS: 0.1 10*3/uL (ref 0.0–0.2)
Basos: 1 %
EOS (ABSOLUTE): 0.1 10*3/uL (ref 0.0–0.4)
Eos: 1 %
HEMOGLOBIN: 14.2 g/dL (ref 11.1–15.9)
Hematocrit: 41.5 % (ref 34.0–46.6)
Immature Grans (Abs): 0 10*3/uL (ref 0.0–0.1)
Immature Granulocytes: 0 %
LYMPHS ABS: 3.3 10*3/uL — AB (ref 0.7–3.1)
LYMPHS: 54 %
MCH: 31.3 pg (ref 26.6–33.0)
MCHC: 34.2 g/dL (ref 31.5–35.7)
MCV: 91 fL (ref 79–97)
MONOCYTES: 3 %
Monocytes Absolute: 0.2 10*3/uL (ref 0.1–0.9)
Neutrophils Absolute: 2.5 10*3/uL (ref 1.4–7.0)
Neutrophils: 41 %
PLATELETS: 338 10*3/uL (ref 150–379)
RBC: 4.54 x10E6/uL (ref 3.77–5.28)
RDW: 12.8 % (ref 12.3–15.4)
WBC: 6.1 10*3/uL (ref 3.4–10.8)

## 2017-02-08 LAB — VITAMIN D 25 HYDROXY (VIT D DEFICIENCY, FRACTURES): VIT D 25 HYDROXY: 16.1 ng/mL — AB (ref 30.0–100.0)

## 2017-02-08 LAB — HEPATITIS PANEL, ACUTE
HEP A IGM: NEGATIVE
HEP B S AG: NEGATIVE
Hep B C IgM: NEGATIVE
Hep C Virus Ab: <0.1 s/co ratio (ref 0.0–0.9)

## 2017-02-08 LAB — VITAMIN B12: VITAMIN B 12: 619 pg/mL (ref 232–1245)

## 2017-02-08 LAB — TSH: TSH: 2.38 u[IU]/mL (ref 0.450–4.500)

## 2017-02-08 MED ORDER — VITAMIN D (ERGOCALCIFEROL) 1.25 MG (50000 UNIT) PO CAPS: 50000.0000 [IU] | ORAL_CAPSULE | ORAL | 0 refills | Status: DC

## 2017-02-27 ENCOUNTER — Other Ambulatory Visit: Payer: Self-pay | Admitting: Family Medicine

## 2017-02-27 DIAGNOSIS — Z30011 Encounter for initial prescription of contraceptive pills: Secondary | ICD-10-CM

## 2017-02-27 MED ORDER — DROSPIRENONE-ETHINYL ESTRADIOL 3-0.03 MG PO TABS: 1.0000 | ORAL_TABLET | Freq: Every day | ORAL | 4 refills | Status: DC

## 2017-02-27 NOTE — Telephone Encounter (Signed)
Pt is requesting that we please call pharmacy and have [place] (birthcontrol) transferred to [id number] [place] [id number]

## 2017-05-23 ENCOUNTER — Encounter: Payer: Self-pay | Admitting: Family Medicine

## 2017-05-23 DIAGNOSIS — Z8349 Family history of other endocrine, nutritional and metabolic diseases: Secondary | ICD-10-CM

## 2017-05-25 ENCOUNTER — Other Ambulatory Visit: Payer: Self-pay | Admitting: Family Medicine

## 2017-05-31 LAB — FERRITIN: Ferritin: 102 ng/mL (ref 15–150)

## 2017-05-31 LAB — HEMOCHROMATOSIS DNA-PCR(C282Y,H63D)

## 2017-06-01 ENCOUNTER — Encounter: Payer: Self-pay | Admitting: Family Medicine

## 2017-06-01 ENCOUNTER — Ambulatory Visit (INDEPENDENT_AMBULATORY_CARE_PROVIDER_SITE_OTHER): Payer: 59 | Admitting: Family Medicine

## 2017-06-01 VITALS — BP 104/60 | HR 89 | Temp 98.7°F | Resp 16 | Ht 68.0 in | Wt 186.4 lb

## 2017-06-01 DIAGNOSIS — Z148 Genetic carrier of other disease: Secondary | ICD-10-CM

## 2017-06-01 DIAGNOSIS — N920 Excessive and frequent menstruation with regular cycle: Secondary | ICD-10-CM

## 2017-06-01 DIAGNOSIS — R748 Abnormal levels of other serum enzymes: Secondary | ICD-10-CM

## 2017-06-01 NOTE — Patient Instructions (Signed)
Please have Ultrasound results called or faxed to [place] at your earliest convenience. Thank you!

## 2017-06-01 NOTE — Progress Notes (Addendum)
Name: Vicki Arnold   MRN: [id number]    DOB: 25-Aug-1991   Date:06/01/2017       Progress Note  Subjective  Chief Complaint  Chief Complaint  Patient presents with  . Results    pt here to discuss test results stated that she had spoken to you already    HPI  Carrier State: Pt presents with her mother to follow up on lab results. Mom has  Hemochromatosis affecting her liver and one kidney, and patient had labs drawn that show she is a carrier with normal ferritin level. LFT's were mildly elevated in February 2018, we will repeat today. Fatty liver was noted on an abdominal US in 2012, and we will order a repeat [place] today. 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 She denies no nausea, vomiting, diarrhea, constipation, weakness, hyperpigmentation, or arthralgias.  Does not eat a particular diet, doesn't avoid fatty foods or alcohol.  She does note that her periods are heavy and last up to 8 days even on OCP's and asks about possible anemia.  Patient Active Problem List   Diagnosis Date Noted  . Thrombocytosis ([place]) 01/06/2016  . Angio-edema 12/24/2015  . Fatty infiltration of liver 12/24/2015  . Hive 12/24/2015  . Hypertriglyceridemia 12/24/2015  . Blood glucose elevated 12/24/2015  . Vitamin D deficiency 12/24/2015  . Performance anxiety 12/24/2015  . Obesity (BMI 30.0-34.9) 09/17/2014    Social History  Substance Use Topics  . Smoking status: Never Smoker  . Smokeless tobacco: Never Used  . Alcohol use 0.0 oz/week     Comment: occasionally     Current Outpatient Prescriptions:  .  drospirenone-ethinyl estradiol (OCELLA) 3-0.03 MG tablet, Take 1 tablet by mouth daily., Disp: 3 Package, Rfl: 4 .  Vitamin D, Ergocalciferol, (DRISDOL) 50000 units CAPS capsule, Take 1 capsule (50,000 Units total) by mouth every 7 (seven) days., Disp: 12 capsule, Rfl: 0 .  diphenhydrAMINE (BENADRYL) 25 mg capsule,  Take by mouth as needed., Disp: , Rfl:   Allergies  Allergen Reactions  . Penicillins Hives    ROS  Ten systems reviewed and is negative except as mentioned in HPI   Objective  Vitals:   06/01/17 1417  BP: 104/60  Pulse: 89  Resp: 16  Temp: 98.7 F (37.1 C)  SpO2: 96%  Weight: 186 lb 6 oz (84.5 kg)  Height: 5\' 8"  (1.727 m)    Body mass index is 28.34 kg/m.  Nursing Note and Vital Signs reviewed.  Physical Exam  Constitutional: Patient appears well-developed and well-nourished. Overweight No distress.  HEENT: head atraumatic, normocephalic Cardiovascular: Normal rate, regular rhythm, S1/S2 present.  No murmur or rub heard. No BLE edema. Pulmonary/Chest: Effort normal and breath sounds clear. No respiratory distress or retractions. Abdominal: Soft and non-tender, bowel sounds present x4 quadrants. Psychiatric: Patient has a normal mood and affect. behavior is normal. Judgment and thought content normal.  Recent Results (from the past 2160 hour(s))  Hemochromatosis DNA-PCR(c282y,h63d)     Status: None   Collection Time: 05/25/17 11:28 AM  Result Value Ref Range   Hemochromatosis Gene Comment     Comment: Result: CARRIER Single mutation (C282Y) identified Interpretation: This patient's sample was analyzed for the hereditary hemochromatosis (HH) mutations C282Y, H63D, and S65C.  A single copy of C282Y was identified.  Results for H63D and S65C were negative. This person is most likely an unaffected  carrier.  Approximately 1 in 9 Caucasians are carriers of HH.  The mutations analyzed by [facility] are most common in the Caucasian population.  Because this panel does not identify rare HH mutations or HH mutations found in other ethnic groups, there are a small number of people who may have a single copy C282Y who are actually affected.  The diagnosis of HH should include clinical findings and other test results, such as transferrin-iron saturation and/or serum ferritin  studies and/or liver biopsy.  HH is inherited in a recessive manner.  Should this individual have children with a partner who is also a carrier for [facility], there is a 25% chance per offspring that he/she is affected.   Genetic counseling and HH molecular testing are recommended for at-risk family members. Methodology: DNA Analysis of the HFE gene was performed by PCR amplification followed by restriction enzyme digestion analyses. Reference: Cathe Mons and Walker AP. (2000). Genet Test 4:97-101. Cogswell ME et al. (1999). AM J Prev Med 16:134-140. Bomford A (2002). Lancet 360(9346):1673-81. Stan Head et al. (2002). Blood Cells, Molecules. and Diseases.  29(3):418-432. Imperatore G et al. (2003). Genet Med. 5(1):1-8. Cogswell ME et al. (2003). Genet Med. 5(4):304-10. This test was developed and its performance characteristics determined by [facility]. It has not been cleared or approved by the Food and Drug Administration. Genetic counselors are available for health care providers to discuss results at 1-800-345-GENE. 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  She is aware that she should speak to a genetic counselor prior to trying to conceive, and knows that we are willing to refer her at any time. 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  2. Elevated liver enzymes - US Abdomen Limited  RUQ - Hepatic function panel - Discussed healthy eating, modest weight loss, and avoiding alcohol and hepatotoxic drugs.  3. Menorrhagia with regular cycle - CBC With Differential - Pt with normal recent ferritin level, but pt is on her last day of her cycle today, so we will check CBC.  -Red flags and when to present for emergency care or RTC including fever >101.51F, chest pain, shortness of breath, new/worsening/un-resolving symptoms, reviewed with patient at time of visit. Follow up and care instructions discussed and provided in AVS. ------------------------------------------------------------- I have reviewed this encounter including the documentation in this note and/or discussed this patient with the Johney Maine, FNP, NP-C. I am certifying that I agree with the content of this note as supervising physician.  Enid Derry, [facility] 06/01/2017, 5:35 PM

## 2017-06-01 NOTE — Progress Notes (Signed)
Ms. Totino,  Here are your results that we discussed today.  Thank you, Raelyn Ensign NP-C

## 2017-06-02 LAB — CBC WITH DIFFERENTIAL
Basophils Absolute: 0 10*3/uL (ref 0.0–0.2)
Basos: 0 %
EOS (ABSOLUTE): 0 10*3/uL (ref 0.0–0.4)
Eos: 0 %
Hematocrit: 37.9 % (ref 34.0–46.6)
Hemoglobin: 12.9 g/dL (ref 11.1–15.9)
Immature Grans (Abs): 0 10*3/uL (ref 0.0–0.1)
Immature Granulocytes: 0 %
Lymphocytes Absolute: 3.6 10*3/uL — ABNORMAL HIGH (ref 0.7–3.1)
Lymphs: 43 %
MCH: 31.4 pg (ref 26.6–33.0)
MCHC: 34 g/dL (ref 31.5–35.7)
MCV: 92 fL (ref 79–97)
Monocytes Absolute: 0.3 10*3/uL (ref 0.1–0.9)
Monocytes: 4 %
Neutrophils Absolute: 4.3 10*3/uL (ref 1.4–7.0)
Neutrophils: 53 %
RBC: 4.11 x10E6/uL (ref 3.77–5.28)
RDW: 12.8 % (ref 12.3–15.4)
WBC: 8.2 10*3/uL (ref 3.4–10.8)

## 2017-06-02 LAB — HEPATIC FUNCTION PANEL
ALT: 32 IU/L (ref 0–32)
AST: 21 IU/L (ref 0–40)
Albumin: 4.2 g/dL (ref 3.5–5.5)
Alkaline Phosphatase: 31 IU/L — ABNORMAL LOW (ref 39–117)
Bilirubin Total: <0.2 mg/dL (ref 0.0–1.2)
Bilirubin, Direct: 0.04 mg/dL (ref 0.00–0.40)
Total Protein: 6.8 g/dL (ref 6.0–8.5)

## 2017-06-02 NOTE — Progress Notes (Signed)
Hello Ms. Vicki Arnold,  I am writing to let you know that I have reviewed your lab results: Your Liver function has returned to normal, and your CBC shows no signs of anemia.  Please feel free to message me back or call the clinic with any questions you may have. Thank you so much, Raelyn Ensign NP-C

## 2017-06-03 ENCOUNTER — Other Ambulatory Visit: Payer: Self-pay | Admitting: Family Medicine

## 2017-06-04 ENCOUNTER — Telehealth: Payer: Self-pay

## 2017-06-04 NOTE — Telephone Encounter (Signed)
I called to inform patient that she has been scheduled to have her [place] on 06/08/17 @ 11am at the [facility] but she stated that she will be leaving to go to [place] soon. I apologized and told her that she could schedule at her convenience.  I then called scheduling and cancelled the appt.

## 2017-06-08 ENCOUNTER — Ambulatory Visit: Payer: Self-pay

## 2017-06-18 ENCOUNTER — Encounter: Payer: Self-pay | Admitting: Family Medicine

## 2017-06-18 DIAGNOSIS — Z148 Genetic carrier of other disease: Secondary | ICD-10-CM | POA: Insufficient documentation

## 2017-09-12 ENCOUNTER — Encounter: Payer: Self-pay | Admitting: Family Medicine

## 2017-09-12 ENCOUNTER — Other Ambulatory Visit: Payer: Self-pay | Admitting: Family Medicine

## 2017-09-12 MED ORDER — VITAMIN D (ERGOCALCIFEROL) 1.25 MG (50000 UNIT) PO CAPS: 50000.0000 [IU] | ORAL_CAPSULE | ORAL | 0 refills | Status: DC

## 2017-10-11 DIAGNOSIS — Z227 Latent tuberculosis: Secondary | ICD-10-CM | POA: Insufficient documentation

## 2018-02-08 ENCOUNTER — Encounter: Payer: 59 | Admitting: Family Medicine

## 2018-04-18 ENCOUNTER — Ambulatory Visit (INDEPENDENT_AMBULATORY_CARE_PROVIDER_SITE_OTHER): Payer: No Typology Code available for payment source | Admitting: Family Medicine

## 2018-04-18 ENCOUNTER — Encounter: Payer: Self-pay | Admitting: Family Medicine

## 2018-04-18 VITALS — BP 110/70 | HR 80 | Temp 99.1°F | Resp 16 | Ht 68.0 in | Wt 199.0 lb

## 2018-04-18 DIAGNOSIS — Z113 Encounter for screening for infections with a predominantly sexual mode of transmission: Secondary | ICD-10-CM | POA: Diagnosis not present

## 2018-04-18 DIAGNOSIS — E785 Hyperlipidemia, unspecified: Secondary | ICD-10-CM

## 2018-04-18 DIAGNOSIS — R748 Abnormal levels of other serum enzymes: Secondary | ICD-10-CM | POA: Diagnosis not present

## 2018-04-18 DIAGNOSIS — Z01419 Encounter for gynecological examination (general) (routine) without abnormal findings: Secondary | ICD-10-CM | POA: Diagnosis not present

## 2018-04-18 DIAGNOSIS — Z124 Encounter for screening for malignant neoplasm of cervix: Secondary | ICD-10-CM

## 2018-04-18 DIAGNOSIS — E88819 Insulin resistance, unspecified: Secondary | ICD-10-CM

## 2018-04-18 DIAGNOSIS — E8881 Metabolic syndrome: Secondary | ICD-10-CM | POA: Diagnosis not present

## 2018-04-18 DIAGNOSIS — Z3041 Encounter for surveillance of contraceptive pills: Secondary | ICD-10-CM

## 2018-04-18 LAB — CBC WITH DIFFERENTIAL/PLATELET
BASOS ABS: 73 {cells}/uL (ref 0–200)
Basophils Relative: 1.1 %
Eosinophils Absolute: 33 cells/uL (ref 15–500)
Eosinophils Relative: 0.5 %
HCT: 38.7 % (ref 35.0–45.0)
Hemoglobin: 13.3 g/dL (ref 11.7–15.5)
Lymphs Abs: 3043 cells/uL (ref 850–3900)
MCH: 31.8 pg (ref 27.0–33.0)
MCHC: 34.4 g/dL (ref 32.0–36.0)
MCV: 92.6 fL (ref 80.0–100.0)
MONOS PCT: 5.1 %
MPV: 10.1 fL (ref 7.5–12.5)
Neutro Abs: 3115 cells/uL (ref 1500–7800)
Neutrophils Relative %: 47.2 %
PLATELETS: 348 10*3/uL (ref 140–400)
RBC: 4.18 10*6/uL (ref 3.80–5.10)
RDW: 11.8 % (ref 11.0–15.0)
TOTAL LYMPHOCYTE: 46.1 %
WBC mixed population: 337 cells/uL (ref 200–950)
WBC: 6.6 10*3/uL (ref 3.8–10.8)

## 2018-04-18 LAB — LIPID PANEL
CHOLESTEROL: 177 mg/dL (ref <200)
HDL: 65 mg/dL (ref >50)
LDL Cholesterol (Calc): 98 mg/dL (calc)
Non-HDL Cholesterol (Calc): 112 mg/dL (calc) (ref <130)
Total CHOL/HDL Ratio: 2.7 (calc) (ref <5.0)
Triglycerides: 59 mg/dL (ref <150)

## 2018-04-18 LAB — COMPLETE METABOLIC PANEL WITH GFR
AG Ratio: 1.9 (calc) (ref 1.0–2.5)
ALBUMIN MSPROF: 4.6 g/dL (ref 3.6–5.1)
ALKALINE PHOSPHATASE (APISO): 38 U/L (ref 33–115)
ALT: 60 U/L — AB (ref 6–29)
AST: 38 U/L — ABNORMAL HIGH (ref 10–30)
BILIRUBIN TOTAL: 0.4 mg/dL (ref 0.2–1.2)
BUN: 9 mg/dL (ref 7–25)
CHLORIDE: 104 mmol/L (ref 98–110)
CO2: 29 mmol/L (ref 20–32)
Calcium: 9.5 mg/dL (ref 8.6–10.2)
Creat: 0.69 mg/dL (ref 0.50–1.10)
GFR, Est African American: 139 mL/min/{1.73_m2} (ref >=60)
GFR, Est Non African American: 120 mL/min/{1.73_m2} (ref >=60)
GLUCOSE: 87 mg/dL (ref 65–99)
Globulin: 2.4 g/dL (calc) (ref 1.9–3.7)
POTASSIUM: 4.2 mmol/L (ref 3.5–5.3)
Sodium: 137 mmol/L (ref 135–146)
Total Protein: 7 g/dL (ref 6.1–8.1)

## 2018-04-18 MED ORDER — DROSPIRENONE-ETHINYL ESTRADIOL 3-0.03 MG PO TABS: 1.0000 | ORAL_TABLET | Freq: Every day | ORAL | 4 refills | Status: DC

## 2018-04-18 NOTE — Progress Notes (Signed)
Name: Vicki Arnold   MRN: [id number]    DOB: October 05, 1991   Date:04/18/2018       Progress Note  Subjective  Chief Complaint  Chief Complaint  Patient presents with  . Annual Exam    HPI   Patient presents for annual CPE .  Diet: trying to do better on diet.  Exercise: she just re-started   USPSTF grade A and B recommendations  Depression:  Depression screen [facility] 2/9 04/18/2018 02/06/2017 12/24/2015  Decreased Interest 0 0 0  Down, Depressed, Hopeless 0 0 0  PHQ - 2 Score 0 0 0   Hypertension: BP Readings from Last 3 Encounters:  04/18/18 110/70  06/01/17 104/60  02/06/17 108/64   Obesity: Wt Readings from Last 3 Encounters:  04/18/18 199 lb (90.3 kg)  06/01/17 186 lb 6 oz (84.5 kg)  02/06/17 187 lb 14.4 oz (85.2 kg)   BMI Readings from Last 3 Encounters:  04/18/18 30.26 kg/m  06/01/17 28.34 kg/m  02/06/17 28.99 kg/m     STD testing and prevention (chl/gon/syphilis): today  Intimate partner violence:negative  Sexual History/Pain during Intercourse: same partner since last year Menstrual History/LMP/Abnormal Bleeding: cycles regular on ocp's, not as heavy, mild cramping Incontinence Symptoms: no stress or urge incontinence  Advanced Care Planning: A voluntary discussion about advance care planning including the explanation and discussion of advance directives.  Discussed health care proxy and Living will, and the patient was able to identify a health care proxy as parents.  Patient does not have a living will at present time. If patient does have living will, I have requested they bring this to the clinic to be scanned in to their chart.  Breast cancer: not indicated  BRCA gene screening: not indicated Cervical cancer screening: today    Lipids:  Lab Results  Component Value Date   CHOL 206 (H) 02/07/2017   CHOL 185 01/05/2016   CHOL 215 (A) 10/07/2014   Lab Results  Component Value Date   HDL 66 02/07/2017   HDL 58 01/05/2016   HDL 71 (A) 10/07/2014    Lab Results  Component Value Date   LDLCALC 124 (H) 02/07/2017   LDLCALC 109 (H) 01/05/2016   LDLCALC 120 10/07/2014   Lab Results  Component Value Date   TRIG 78 02/07/2017   TRIG 89 01/05/2016   TRIG 118 10/07/2014   Lab Results  Component Value Date   CHOLHDL 3.1 02/07/2017   CHOLHDL 3.2 01/05/2016   No results found for: LDLDIRECT  Glucose:  Glucose  Date Value Ref Range Status  02/07/2017 85 65 - 99 mg/dL Final  01/05/2016 95 65 - 99 mg/dL Final  07/10/2014 115 (H) 65 - 99 mg/dL Final     Patient Active Problem List   Diagnosis Date Noted  . Insulin resistance 04/18/2018  . Latent tuberculosis by blood test 10/11/2017  . 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 Angio-edema 12/24/2015  . Fatty infiltration of liver 12/24/2015  . Hive 12/24/2015  . Hypertriglyceridemia 12/24/2015  . Blood glucose elevated 12/24/2015  . Vitamin D deficiency 12/24/2015  . Performance anxiety 12/24/2015  . Obesity (BMI 30.0-34.9) 09/17/2014    Past Surgical History:  Procedure Laterality Date  . BREAST ENHANCEMENT SURGERY Bilateral 05/28/2014   Reduction  . CHOLECYSTECTOMY  08/02/2011   Lap-Dr. Fleet Contras  . WISDOM TOOTH EXTRACTION      Family History  Problem Relation Age of Onset  . Diabetes Mother        Pre-Diabetic  . Hypertension  Mother   . Hemochromatosis Mother   . Hypertension Father     Social History   Socioeconomic History  . Marital status: Single    Spouse name: Not on file  . Number of children: 0  . Years of education: Not on file  . Highest education level: Bachelor's degree (e.g., BA, AB, BS)  Occupational History  . Occupation: Therapist, sports    Comment: Multimedia programmer  Social Needs  . Financial resource strain: Not hard at all  . Food insecurity:    Worry: Never true    Inability: Never true  . Transportation needs:    Medical: No    Non-medical: No  Tobacco Use  . Smoking status: Never Smoker  . Smokeless tobacco: Never Used  Substance and  Sexual Activity  . Alcohol use: Yes    Alcohol/week: 0.0 oz    Comment: occasionally  . Drug use: No  . Sexual activity: Yes    Partners: Male    Birth control/protection: Condom  Lifestyle  . Physical activity:    Days per week: 3 days    Minutes per session: 60 min  . Stress: Not at all  Relationships  . Social connections:    Talks on phone: More than three times a week    Gets together: More than three times a week    Attends religious service: More than 4 times per year    Active member of club or organization: No    Attends meetings of clubs or organizations: Never    Relationship status: Never married  . Intimate partner violence:    Fear of current or ex partner: No    Emotionally abused: No    Physically abused: No    Forced sexual activity: No  Other Topics Concern  . Not on file  Social History Narrative  . Not on file     Current Outpatient Medications:  .  diphenhydrAMINE (BENADRYL) 25 mg capsule, Take by mouth as needed., Disp: , Rfl:  .  drospirenone-ethinyl estradiol (OCELLA) 3-0.03 MG tablet, Take 1 tablet by mouth daily., Disp: 3 Package, Rfl: 4  Allergies  Allergen Reactions  . Penicillins Hives     ROS   Constitutional: Negative for fever , positive for some  weight change.  Respiratory: Negative for cough and shortness of breath.   Cardiovascular: Negative for chest pain or palpitations.  Gastrointestinal: Negative for abdominal pain, no bowel changes.  Musculoskeletal: Negative for gait problem or joint swelling.  Skin: Negative for rash.  Neurological: Negative for dizziness or headache.  No other specific complaints in a complete review of systems (except as listed in HPI above).   Objective  Vitals:   04/18/18 0854  BP: 110/70  Pulse: 80  Resp: 16  Temp: 99.1 F (37.3 C)  TempSrc: Oral  SpO2: 97%  Weight: 199 lb (90.3 kg)  Height: '5\' 8"'  (1.727 m)    Body mass index is 30.26 kg/m.  Physical Exam  Constitutional:  Patient appears well-developed and obese.  No distress.  HENT: Head: Normocephalic and atraumatic. Ears: B TMs ok, no erythema or effusion; Nose: Nose normal. Mouth/Throat: Oropharynx is clear and moist. No oropharyngeal exudate.  Eyes: Conjunctivae and EOM are normal. Pupils are equal, round, and reactive to light. No scleral icterus.  Neck: Normal range of motion. Neck supple. No JVD present. No thyromegaly present.  Cardiovascular: Normal rate, regular rhythm and normal heart sounds.  No murmur heard. No BLE edema. Pulmonary/Chest: Effort normal and breath  sounds normal. No respiratory distress. Abdominal: Soft. Bowel sounds are normal, no distension. There is no tenderness. no masses Breast: no lumps or masses, no nipple discharge or rashes FEMALE GENITALIA:  External genitalia normal External urethra normal Vaginal vault normal without discharge or lesions Cervix normal without discharge or lesions Bimanual exam normal without masses RECTAL:not done Musculoskeletal: Normal range of motion, no joint effusions. No gross deformities Neurological: he is alert and oriented to person, place, and time. No cranial nerve deficit. Coordination, balance, strength, speech and gait are normal.  Skin: Skin is warm and dry. No rash noted. No erythema.  Psychiatric: Patient has a normal mood and affect. behavior is normal. Judgment and thought content normal.    PHQ2/9: Depression screen [facility] 2/9 04/18/2018 02/06/2017 12/24/2015  Decreased Interest 0 0 0  Down, Depressed, Hopeless 0 0 0  PHQ - 2 Score 0 0 0    Fall Risk: Fall Risk  04/18/2018 02/06/2017 12/24/2015  Falls in the past year? No No No    Functional Status Survey: Is the patient deaf or have difficulty hearing?: No Does the patient have difficulty seeing, even when wearing glasses/contacts?: No Does the patient have difficulty concentrating, remembering, or making decisions?: No Does the patient have difficulty walking or climbing  stairs?: No Does the patient have difficulty dressing or bathing?: No Does the patient have difficulty doing errands alone such as visiting a doctor's office or shopping?: No   Assessment & Plan  1. Well woman exam  Discussed importance of 150 minutes of physical activity weekly, eat two servings of fish weekly, eat one serving of tree nuts ( cashews, pistachios, pecans, almonds.Marland Kitchen) every other day, eat 6 servings of fruit/vegetables daily and drink plenty of water and avoid sweet beverages.  - CBC with Differential/Platelet - COMPLETE METABOLIC PANEL WITH GFR - Hemoglobin A1c - HIV antibody - Lipid panel - RPR - C. trachomatis/N. gonorrhoeae RNA - Hepatitis, Acute  2. Encounter for surveillance of contraceptive pills  - drospirenone-ethinyl estradiol (OCELLA) 3-0.03 MG tablet; Take 1 tablet by mouth daily.  Dispense: 3 Package; Refill: 4  3. Elevated liver enzymes  - COMPLETE METABOLIC PANEL WITH GFR  4. Insulin resistance  - Hemoglobin A1c  5. Dyslipidemia  - Lipid panel  6. Routine screening for STI (sexually transmitted infection)  - HIV antibody - RPR - Hepatitis, Acute  7. Cervical cancer screening  - Pap IG and Chlamydia/Gonococcus, NAA

## 2018-04-18 NOTE — Patient Instructions (Signed)
Preventive Care 18-39 Years, Female Preventive care refers to lifestyle choices and visits with your health care provider that can promote health and wellness. What does preventive care include?  A yearly physical exam. This is also called an annual well check.  Dental exams once or twice a year.  Routine eye exams. Ask your health care provider how often you should have your eyes checked.  Personal lifestyle choices, including: ? Daily care of your teeth and gums. ? Regular physical activity. ? Eating a healthy diet. ? Avoiding tobacco and drug use. ? Limiting alcohol use. ? Practicing safe sex. ? Taking vitamin and mineral supplements as recommended by your health care provider. What happens during an annual well check? The services and screenings done by your health care provider during your annual well check will depend on your age, overall health, lifestyle risk factors, and family history of disease. Counseling Your health care provider may ask you questions about your:  Alcohol use.  Tobacco use.  Drug use.  Emotional well-being.  Home and relationship well-being.  Sexual activity.  Eating habits.  Work and work Statistician.  Method of birth control.  Menstrual cycle.  Pregnancy history.  Screening You may have the following tests or measurements:  Height, weight, and BMI.  Diabetes screening. This is done by checking your blood sugar (glucose) after you have not eaten for a while (fasting).  Blood pressure.  Lipid and cholesterol levels. These may be checked every 5 years starting at age 66.  Skin check.  Hepatitis C blood test.  Hepatitis B blood test.  Sexually transmitted disease (STD) testing.  BRCA-related cancer screening. This may be done if you have a family history of breast, ovarian, tubal, or peritoneal cancers.  Pelvic exam and Pap test. This may be done every 3 years starting at age 40. Starting at age 59, this may be done every 5  years if you have a Pap test in combination with an HPV test.  Discuss your test results, treatment options, and if necessary, the need for more tests with your health care provider. Vaccines Your health care provider may recommend certain vaccines, such as:  Influenza vaccine. This is recommended every year.  Tetanus, diphtheria, and acellular pertussis (Tdap, Td) vaccine. You may need a Td booster every 10 years.  Varicella vaccine. You may need this if you have not been vaccinated.  HPV vaccine. If you are 69 or younger, you may need three doses over 6 months.  Measles, mumps, and rubella (MMR) vaccine. You may need at least one dose of MMR. You may also need a second dose.  Pneumococcal 13-valent conjugate (PCV13) vaccine. You may need this if you have certain conditions and were not previously vaccinated.  Pneumococcal polysaccharide (PPSV23) vaccine. You may need one or two doses if you smoke cigarettes or if you have certain conditions.  Meningococcal vaccine. One dose is recommended if you are age 27-21 years and a first-year college student living in a residence hall, or if you have one of several medical conditions. You may also need additional booster doses.  Hepatitis A vaccine. You may need this if you have certain conditions or if you travel or work in places where you may be exposed to hepatitis A.  Hepatitis B vaccine. You may need this if you have certain conditions or if you travel or work in places where you may be exposed to hepatitis B.  Haemophilus influenzae type b (Hib) vaccine. You may need this if  you have certain risk factors.  Talk to your health care provider about which screenings and vaccines you need and how often you need them. This information is not intended to replace advice given to you by your health care provider. Make sure you discuss any questions you have with your health care provider. Document Released: 02/06/2002 Document Revised: 08/30/2016  Document Reviewed: 10/12/2015 Elsevier Interactive Patient Education  [employer].

## 2018-04-19 LAB — PAP IG, CT-NG NAA, HPV HIGH-RISK
C. trachomatis RNA, TMA: NOT DETECTED
HPV DNA High Risk: NOT DETECTED
N. gonorrhoeae RNA, TMA: NOT DETECTED

## 2018-04-19 LAB — RPR: RPR: NONREACTIVE

## 2018-04-19 LAB — HEPATITIS PANEL, ACUTE
HEP A IGM: NONREACTIVE
HEP B S AG: NONREACTIVE
Hep B C IgM: NONREACTIVE
Hepatitis C Ab: NONREACTIVE
SIGNAL TO CUT-OFF: 0.02 (ref <1.00)

## 2018-04-19 LAB — HIV ANTIBODY (ROUTINE TESTING W REFLEX): HIV: NONREACTIVE

## 2018-04-19 LAB — HEMOGLOBIN A1C
EAG (MMOL/L): 5.2 (calc)
Hgb A1c MFr Bld: 4.9 % of total Hgb (ref <5.7)
MEAN PLASMA GLUCOSE: 94 (calc)

## 2018-11-22 ENCOUNTER — Encounter: Payer: Self-pay | Admitting: Family Medicine

## 2018-11-22 ENCOUNTER — Ambulatory Visit (INDEPENDENT_AMBULATORY_CARE_PROVIDER_SITE_OTHER): Payer: No Typology Code available for payment source | Admitting: Family Medicine

## 2018-11-22 VITALS — BP 112/66 | HR 99 | Temp 98.4°F | Ht 67.0 in | Wt 197.8 lb

## 2018-11-22 DIAGNOSIS — H049 Disorder of lacrimal system, unspecified: Secondary | ICD-10-CM | POA: Diagnosis not present

## 2018-11-22 NOTE — Progress Notes (Signed)
Name: Vicki Arnold   MRN: [id number]    DOB: 08-18-1991   Date:11/22/2018       Progress Note  Subjective  Chief Complaint  Chief Complaint  Patient presents with  . Eye Pain    Right eye pain, onset Wednesday. Continuously draining with pressure. Had similar issue a month ago and it was resolved within a day    HPI  Tear Duct Disorder: she noticed similar episode of watery right eye about one month ago, she used warm compresses for about one day and symptoms resolved, but returned two days ago and is not improving with warm compresses. 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 No redness , pain or blurred vision. Explained to continue warm compresses but may have to see ophthalmologist if no improvement   Patient Active Problem List   Diagnosis Date Noted  . Insulin resistance 04/18/2018  . Latent tuberculosis by blood test 10/11/2017  . 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 Angio-edema 12/24/2015  . Fatty infiltration of liver 12/24/2015  . Hive 12/24/2015  . Hypertriglyceridemia 12/24/2015  . Vitamin D deficiency 12/24/2015  . Performance anxiety 12/24/2015  . Obesity (BMI 30.0-34.9) 09/17/2014    Social History   Tobacco Use  . Smoking status: Never Smoker  . Smokeless tobacco: Never Used  Substance Use Topics  . Alcohol use: Yes    Alcohol/week: 0.0 standard drinks    Comment: occasionally     Current Outpatient Medications:  .  cholecalciferol (VITAMIN D3) 25 MCG (1000 UT) tablet, Take 1,000 Units by mouth daily., Disp: , Rfl:  .  diphenhydrAMINE (BENADRYL) 25 mg capsule, Take by mouth as needed., Disp: , Rfl:  .  drospirenone-ethinyl estradiol (OCELLA) 3-0.03 MG tablet, Take 1 tablet by mouth daily., Disp: 3 Package, Rfl: 4  Allergies  Allergen Reactions  . Penicillins Hives    ROS  Ten systems reviewed and is negative except as mentioned in HPI   Objective  Vitals:   11/22/18 1044  BP: 112/66   Pulse: 99  Temp: 98.4 F (36.9 C)  SpO2: 99%  Weight: 197 lb 12.8 oz (89.7 kg)  Height: 5\' 7"  (1.702 m)    Body mass index is 30.98 kg/m.    Physical Exam  Constitutional: Patient appears well-developed and well-nourished. Obese No distress.  HEENT: head atraumatic, normocephalic, pupils equal and reactive to light, water left eye, conjunctiva intact, neck supple, throat within normal limits Cardiovascular: Normal rate, regular rhythm and normal heart sounds.  No murmur heard. No BLE edema. Pulmonary/Chest: Effort normal and breath sounds normal. No respiratory distress. Abdominal: Soft.  There is no tenderness. Psychiatric: Patient has a normal mood and affect. behavior is normal. Judgment and thought content normal.   Assessment & Plan  1. Disorder of tear duct system  - Ambulatory referral to Ophthalmology

## 2019-04-22 ENCOUNTER — Encounter: Payer: No Typology Code available for payment source | Admitting: Family Medicine

## 2019-05-14 ENCOUNTER — Encounter: Payer: Self-pay | Admitting: Family Medicine

## 2019-05-14 ENCOUNTER — Other Ambulatory Visit: Payer: Self-pay

## 2019-05-14 ENCOUNTER — Other Ambulatory Visit (HOSPITAL_COMMUNITY)
Admission: RE | Admit: 2019-05-14 | Discharge: 2019-05-14 | Disposition: A | Discharge status: Home / Self Care | Payer: Self-pay | Source: Ambulatory Visit | Attending: Family Medicine | Admitting: Family Medicine

## 2019-05-14 ENCOUNTER — Ambulatory Visit (INDEPENDENT_AMBULATORY_CARE_PROVIDER_SITE_OTHER): Payer: No Typology Code available for payment source | Admitting: Family Medicine

## 2019-05-14 VITALS — BP 126/84 | HR 70 | Temp 97.5°F | Resp 16 | Ht 69.0 in | Wt 192.9 lb

## 2019-05-14 DIAGNOSIS — Z124 Encounter for screening for malignant neoplasm of cervix: Secondary | ICD-10-CM

## 2019-05-14 DIAGNOSIS — Z148 Genetic carrier of other disease: Secondary | ICD-10-CM | POA: Diagnosis not present

## 2019-05-14 DIAGNOSIS — Z01419 Encounter for gynecological examination (general) (routine) without abnormal findings: Secondary | ICD-10-CM | POA: Insufficient documentation

## 2019-05-14 DIAGNOSIS — E88819 Insulin resistance, unspecified: Secondary | ICD-10-CM

## 2019-05-14 DIAGNOSIS — E559 Vitamin D deficiency, unspecified: Secondary | ICD-10-CM

## 2019-05-14 DIAGNOSIS — E8881 Metabolic syndrome: Secondary | ICD-10-CM

## 2019-05-14 DIAGNOSIS — K760 Fatty (change of) liver, not elsewhere classified: Secondary | ICD-10-CM

## 2019-05-14 DIAGNOSIS — Z3041 Encounter for surveillance of contraceptive pills: Secondary | ICD-10-CM

## 2019-05-14 DIAGNOSIS — Z113 Encounter for screening for infections with a predominantly sexual mode of transmission: Secondary | ICD-10-CM

## 2019-05-14 DIAGNOSIS — E785 Hyperlipidemia, unspecified: Secondary | ICD-10-CM

## 2019-05-14 DIAGNOSIS — N920 Excessive and frequent menstruation with regular cycle: Secondary | ICD-10-CM

## 2019-05-14 MED ORDER — DROSPIRENONE-ETHINYL ESTRADIOL 3-0.03 MG PO TABS: 1.0000 | ORAL_TABLET | Freq: Every day | ORAL | 4 refills | Status: DC

## 2019-05-14 NOTE — Progress Notes (Signed)
Name: Vicki Arnold   MRN: [id number]    DOB: [birth year]   Date:05/14/2019       Progress Note  Subjective  Chief Complaint  Chief Complaint  Patient presents with  . 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  Overweight: she lost 6 lbs since last visit, she will try to increase physical activity, also discussed cutting carbs down to 100 g per day  Diet: avoiding sodas , eating more home made meals Exercise: going for walks occasionally, discussed 150 minutes weekly   USPSTF grade A and B recommendations    Office Visit from 05/14/2019 in [facility]  AUDIT-C Score  1     Depression: Phq 9 is  negative Depression screen [facility] 2/9 05/14/2019 11/22/2018 04/18/2018 02/06/2017 12/24/2015  Decreased Interest 0 0 0 0 0  Down, Depressed, Hopeless 0 0 0 0 0  PHQ - 2 Score 0 0 0 0 0  Altered sleeping 0 0 - - -  Tired, decreased energy 0 0 - - -  Change in appetite 0 0 - - -  Feeling bad or failure about yourself  0 0 - - -  Trouble concentrating 0 0 - - -  Moving slowly or fidgety/restless 0 0 - - -  Suicidal thoughts 0 0 - - -  PHQ-9 Score 0 0 - - -  Difficult doing work/chores Not difficult at all Not difficult at all - - -   Hypertension: BP Readings from Last 3 Encounters:  05/14/19 126/84  11/22/18 112/66  04/18/18 110/70   Obesity: Wt Readings from Last 3 Encounters:  05/14/19 192 lb 14.4 oz (87.5 kg)  11/22/18 197 lb 12.8 oz (89.7 kg)  04/18/18 199 lb  (90.3 kg)   BMI Readings from Last 3 Encounters:  05/14/19 28.49 kg/m  11/22/18 30.98 kg/m  04/18/18 30.26 kg/m    Hep C Screening: today  STD testing and prevention (HIV/chl/gon/syphilis): today  Intimate partner violence:negative  Sexual History/Pain during Intercourse: no pain during sex, uses condoms 100% Menstrual History/LMP/Abnormal Bleeding: regular cycles and not as heavy with ocp  Incontinence Symptoms: none   Advanced Care Planning: A voluntary discussion about advance care planning including the explanation and discussion of advance directives.  Discussed health care proxy and Living will, and the patient was able to identify a health care proxy as mother.  Patient does not have a living will at present time. If patient does have living will, I have requested they bring this to the clinic to be scanned in to their chart.  Breast cancer: start mammogram at 4  BRCA gene screening: N/A Cervical cancer screening: today    Lipids:  Lab Results  Component Value Date   CHOL 177 04/18/2018   CHOL 206 (H) 02/07/2017   CHOL 185 01/05/2016   Lab Results  Component Value Date   HDL 65 04/18/2018   HDL 66 02/07/2017   HDL 58 01/05/2016   Lab Results  Component Value Date   LDLCALC 98 04/18/2018   LDLCALC 124 (H) 02/07/2017   LDLCALC 109 (H) 01/05/2016   Lab Results  Component Value Date   TRIG 59 04/18/2018   TRIG 78 02/07/2017   TRIG 89 01/05/2016   Lab Results  Component Value Date   CHOLHDL 2.7 04/18/2018   CHOLHDL 3.1 02/07/2017   CHOLHDL 3.2 01/05/2016   No results found for: LDLDIRECT  Glucose:  Glucose  Date Value Ref Range Status  02/07/2017 85 65 - 99 mg/dL Final  01/05/2016 95 65 - 99 mg/dL Final  07/10/2014 115 (H) 65 - 99 mg/dL Final   Glucose, Bld  Date Value Ref Range Status  04/18/2018 87 65 - 99 mg/dL Final    Comment:    .            Fasting reference interval .     Skin cancer: discussed atypical lesions   Patient Active  Problem List   Diagnosis Date Noted  . Insulin resistance 04/18/2018  . Latent tuberculosis by blood test 10/11/2017  . 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 Angio-edema 12/24/2015  . Fatty infiltration of liver 12/24/2015  . Hive 12/24/2015  . Hypertriglyceridemia 12/24/2015  . Vitamin D deficiency 12/24/2015  . Performance anxiety 12/24/2015  . Obesity (BMI 30.0-34.9) 09/17/2014    Past Surgical History:  Procedure Laterality Date  . BREAST ENHANCEMENT SURGERY Bilateral 05/28/2014   Reduction  . CHOLECYSTECTOMY  08/02/2011   Lap-Dr. Fleet Contras  . WISDOM TOOTH EXTRACTION      Family History  Problem Relation Age of Onset  . Diabetes Mother        Pre-Diabetic  . Hypertension Mother   . Hemochromatosis Mother   . Hypertension Father   . Diabetes Father     Social History   Socioeconomic History  . Marital status: Single    Spouse name: Not on file  . Number of children: 0  . Years of education: Not on file  . Highest education level: Bachelor's degree (e.g., BA, AB, BS)  Occupational History  . Occupation: Therapist, sports    Comment: Multimedia programmer  Social Needs  . Financial resource strain: Not hard at all  . Food insecurity:    Worry: Never true    Inability: Never true  . Transportation needs:    Medical: No    Non-medical: No  Tobacco Use  . Smoking status: Never Smoker  . Smokeless tobacco: Never Used  Substance and Sexual Activity  . Alcohol use: Yes    Alcohol/week: 0.0 standard drinks    Comment: occasionally  . Drug use: No  . Sexual activity: Yes    Partners: Male    Birth control/protection: Condom  Lifestyle  . Physical activity:    Days per week: 0 days    Minutes per session: 0 min  . Stress: Not at all  Relationships  . Social connections:    Talks on phone: More than three times a week    Gets together: More than three times a week    Attends religious service: More than 4 times per year    Active member of club or organization: No     Attends meetings of clubs or organizations: Never    Relationship status: Never married  . Intimate partner violence:    Fear of current or ex partner: No    Emotionally abused: No    Physically abused: No    Forced sexual activity: No  Other Topics Concern  .  Not on file  Social History Narrative  . Not on file     Current Outpatient Medications:  .  cholecalciferol (VITAMIN D3) 25 MCG (1000 UT) tablet, Take 1,000 Units by mouth daily., Disp: , Rfl:  .  diphenhydrAMINE (BENADRYL) 25 mg capsule, Take by mouth as needed., Disp: , Rfl:  .  drospirenone-ethinyl estradiol (OCELLA) 3-0.03 MG tablet, Take 1 tablet by mouth daily., Disp: 3 Package, Rfl: 4  Allergies  Allergen Reactions  . Penicillins Hives     ROS  Constitutional: Negative for fever , positive for mild weight change.  Respiratory: Negative for cough and shortness of breath.   Cardiovascular: Negative for chest pain or palpitations.  Gastrointestinal: Negative for abdominal pain, no bowel changes.  Musculoskeletal: Negative for gait problem or joint swelling.  Skin: Negative for rash.  Neurological: Negative for dizziness or headache.  No other specific complaints in a complete review of systems (except as listed in HPI above).  Objective  Vitals:   05/14/19 0856  BP: 126/84  Pulse: 70  Resp: 16  Temp: (!) 97.5 F (36.4 C)  TempSrc: Oral  SpO2: 98%  Weight: 192 lb 14.4 oz (87.5 kg)  Height: _0  (1.753 m)    Body mass index is 28.49 kg/m.  Physical Exam  Constitutional: Patient appears well-developed and well-nourished. No distress.  HENT: Head: Normocephalic and atraumatic. Ears: B TMs ok, no erythema or effusion; Nose: Nose normal. Mouth/Throat: Oropharynx is clear and moist. No oropharyngeal exudate.  Eyes: Conjunctivae and EOM are normal. Pupils are equal, round, and reactive to light. No scleral icterus.  Neck: Normal range of motion. Neck supple. No JVD present. No thyromegaly present.   Cardiovascular: Normal rate, regular rhythm and normal heart sounds.  No murmur heard. No BLE edema. Pulmonary/Chest: Effort normal and breath sounds normal. No respiratory distress. Abdominal: Soft. Bowel sounds are normal, no distension. There is no tenderness. no masses Breast: no lumps or masses, no nipple discharge or rashes FEMALE GENITALIA:  External genitalia normal External urethra normal Vaginal vault normal without discharge or lesions Cervix normal without discharge or lesions Bimanual exam normal without masses RECTAL: not done Musculoskeletal: Normal range of motion, no joint effusions. No gross deformities Neurological: he is alert and oriented to person, place, and time. No cranial nerve deficit. Coordination, balance, strength, speech and gait are normal.  Skin: Skin is warm and dry. No rash noted. No erythema.  Psychiatric: Patient has a normal mood and affect. behavior is normal. Judgment and thought content normal.   PHQ2/9: Depression screen [facility] 2/9 05/14/2019 11/22/2018 04/18/2018 02/06/2017 12/24/2015  Decreased Interest 0 0 0 0 0  Down, Depressed, Hopeless 0 0 0 0 0  PHQ - 2 Score 0 0 0 0 0  Altered sleeping 0 0 - - -  Tired, decreased energy 0 0 - - -  Change in appetite 0 0 - - -  Feeling bad or failure about yourself  0 0 - - -  Trouble concentrating 0 0 - - -  Moving slowly or fidgety/restless 0 0 - - -  Suicidal thoughts 0 0 - - -  PHQ-9 Score 0 0 - - -  Difficult doing work/chores Not difficult at all Not difficult at all - - -     Fall Risk: Fall Risk  05/14/2019 11/22/2018 04/18/2018 02/06/2017 12/24/2015  Falls in the past year? 0 0 No No No  Number falls in past yr: 0 - - - -  Injury with Fall?  0 - - - -    Functional Status Survey: Is the patient deaf or have difficulty hearing?: No Does the patient have difficulty seeing, even when wearing glasses/contacts?: No Does the patient have difficulty concentrating, remembering, or making  decisions?: No Does the patient have difficulty walking or climbing stairs?: No Does the patient have difficulty dressing or bathing?: No Does the patient have difficulty doing errands alone such as visiting a doctor's office or shopping?: No  Assessment & Plan  1. Well woman exam  - CBC with Differential/Platelet - COMPLETE METABOLIC PANEL WITH GFR - Lipid panel - Hemoglobin A1c - HIV Antibody (routine testing w rflx) - Hepatitis panel, acute - RPR - Cytology - PAP - VITAMIN D 25 Hydroxy (Vit-D Deficiency, Fractures)  2. Encounter for surveillance of contraceptive pills  - drospirenone-ethinyl estradiol (OCELLA) 3-0.03 MG tablet; Take 1 tablet by mouth daily.  Dispense: 3 Package; Refill: 4  3. Insulin resistance  - Hemoglobin A1c  4. Hemochromatosis carrier  - CBC with Differential/Platelet  5. Cervical cancer screening  - Cytology - PAP  6. Dyslipidemia  - Lipid panel  7. Menorrhagia with regular cycle   8. Fatty infiltration of liver  - COMPLETE METABOLIC PANEL WITH GFR  9. Routine screening for STI (sexually transmitted infection)  - HIV Antibody (routine testing w rflx) - Hepatitis panel, acute - RPR  10. Vitamin D deficiency  - VITAMIN D 25 Hydroxy (Vit-D Deficiency, Fractures)  -USPSTF grade A and B recommendations reviewed with patient; age-appropriate recommendations, preventive care, screening tests, etc discussed and encouraged; healthy living encouraged; see AVS for patient education given to patient -Discussed importance of 150 minutes of physical activity weekly, eat two servings of fish weekly, eat one serving of tree nuts ( cashews, pistachios, pecans, almonds.Marland Kitchen) every other day, eat 6 servings of fruit/vegetables daily and drink plenty of water and avoid sweet beverages.

## 2019-05-14 NOTE — Patient Instructions (Signed)
Preventive Care 18-39 Years, Female Preventive care refers to lifestyle choices and visits with your health care provider that can promote health and wellness. What does preventive care include?   A yearly physical exam. This is also called an annual well check.  Dental exams once or twice a year.  Routine eye exams. Ask your health care provider how often you should have your eyes checked.  Personal lifestyle choices, including: ? Daily care of your teeth and gums. ? Regular physical activity. ? Eating a healthy diet. ? Avoiding tobacco and drug use. ? Limiting alcohol use. ? Practicing safe sex. ? Taking vitamin and mineral supplements as recommended by your health care provider. What happens during an annual well check? The services and screenings done by your health care provider during your annual well check will depend on your age, overall health, lifestyle risk factors, and family history of disease. Counseling Your health care provider may ask you questions about your:  Alcohol use.  Tobacco use.  Drug use.  Emotional well-being.  Home and relationship well-being.  Sexual activity.  Eating habits.  Work and work environment.  Method of birth control.  Menstrual cycle.  Pregnancy history. Screening You may have the following tests or measurements:  Height, weight, and BMI.  Diabetes screening. This is done by checking your blood sugar (glucose) after you have not eaten for a while (fasting).  Blood pressure.  Lipid and cholesterol levels. These may be checked every 5 years starting at age 20.  Skin check.  Hepatitis C blood test.  Hepatitis B blood test.  Sexually transmitted disease (STD) testing.  BRCA-related cancer screening. This may be done if you have a family history of breast, ovarian, tubal, or peritoneal cancers.  Pelvic exam and Pap test. This may be done every 3 years starting at age 21. Starting at age 30, this may be done every 5  years if you have a Pap test in combination with an HPV test. Discuss your test results, treatment options, and if necessary, the need for more tests with your health care provider. Vaccines Your health care provider may recommend certain vaccines, such as:  Influenza vaccine. This is recommended every year.  Tetanus, diphtheria, and acellular pertussis (Tdap, Td) vaccine. You may need a Td booster every 10 years.  Varicella vaccine. You may need this if you have not been vaccinated.  HPV vaccine. If you are 26 or younger, you may need three doses over 6 months.  Measles, mumps, and rubella (MMR) vaccine. You may need at least one dose of MMR. You may also need a second dose.  Pneumococcal 13-valent conjugate (PCV13) vaccine. You may need this if you have certain conditions and were not previously vaccinated.  Pneumococcal polysaccharide (PPSV23) vaccine. You may need one or two doses if you smoke cigarettes or if you have certain conditions.  Meningococcal vaccine. One dose is recommended if you are age 19-21 years and a first-year college student living in a residence hall, or if you have one of several medical conditions. You may also need additional booster doses.  Hepatitis A vaccine. You may need this if you have certain conditions or if you travel or work in places where you may be exposed to hepatitis A.  Hepatitis B vaccine. You may need this if you have certain conditions or if you travel or work in places where you may be exposed to hepatitis B.  Haemophilus influenzae type b (Hib) vaccine. You may need this if you   have certain risk factors. Talk to your health care provider about which screenings and vaccines you need and how often you need them. This information is not intended to replace advice given to you by your health care provider. Make sure you discuss any questions you have with your health care provider. Document Released: 02/06/2002 Document Revised: 07/24/2017  Document Reviewed: 10/12/2015 Elsevier Interactive Patient Education  2019 [employer].

## 2019-05-15 LAB — CYTOLOGY - PAP
Chlamydia: NEGATIVE
Diagnosis: NEGATIVE
Neisseria Gonorrhea: NEGATIVE

## 2019-05-15 LAB — CBC WITH DIFFERENTIAL/PLATELET
Absolute Monocytes: 312 cells/uL (ref 200–950)
Basophils Absolute: 72 cells/uL (ref 0–200)
Basophils Relative: 0.9 %
Eosinophils Absolute: 40 cells/uL (ref 15–500)
Eosinophils Relative: 0.5 %
HCT: 41.3 % (ref 35.0–45.0)
Hemoglobin: 14.2 g/dL (ref 11.7–15.5)
Lymphs Abs: 3208 cells/uL (ref 850–3900)
MCH: 31.8 pg (ref 27.0–33.0)
MCHC: 34.4 g/dL (ref 32.0–36.0)
MCV: 92.4 fL (ref 80.0–100.0)
MPV: 10.5 fL (ref 7.5–12.5)
Monocytes Relative: 3.9 %
Neutro Abs: 4368 cells/uL (ref 1500–7800)
Neutrophils Relative %: 54.6 %
Platelets: 409 10*3/uL — ABNORMAL HIGH (ref 140–400)
RBC: 4.47 10*6/uL (ref 3.80–5.10)
RDW: 12.1 % (ref 11.0–15.0)
Total Lymphocyte: 40.1 %
WBC: 8 10*3/uL (ref 3.8–10.8)

## 2019-05-15 LAB — COMPLETE METABOLIC PANEL WITH GFR
AG Ratio: 1.5 (calc) (ref 1.0–2.5)
ALT: 26 U/L (ref 6–29)
AST: 24 U/L (ref 10–30)
Albumin: 4.3 g/dL (ref 3.6–5.1)
Alkaline phosphatase (APISO): 33 U/L (ref 31–125)
BUN: 8 mg/dL (ref 7–25)
CO2: 23 mmol/L (ref 20–32)
Calcium: 9.5 mg/dL (ref 8.6–10.2)
Chloride: 101 mmol/L (ref 98–110)
Creat: 0.78 mg/dL (ref 0.50–1.10)
GFR, Est African American: 121 mL/min/{1.73_m2} (ref >=60)
GFR, Est Non African American: 104 mL/min/{1.73_m2} (ref >=60)
Globulin: 2.9 g/dL (calc) (ref 1.9–3.7)
Glucose, Bld: 83 mg/dL (ref 65–99)
Potassium: 4.2 mmol/L (ref 3.5–5.3)
Sodium: 135 mmol/L (ref 135–146)
Total Bilirubin: 0.5 mg/dL (ref 0.2–1.2)
Total Protein: 7.2 g/dL (ref 6.1–8.1)

## 2019-05-15 LAB — HEPATITIS PANEL, ACUTE
Hep A IgM: NONREACTIVE
Hep B C IgM: NONREACTIVE
Hepatitis B Surface Ag: NONREACTIVE
Hepatitis C Ab: NONREACTIVE
SIGNAL TO CUT-OFF: 0.02 (ref <1.00)

## 2019-05-15 LAB — LIPID PANEL
Cholesterol: 241 mg/dL — ABNORMAL HIGH (ref <200)
HDL: 74 mg/dL (ref >=50)
LDL Cholesterol (Calc): 141 mg/dL (calc) — ABNORMAL HIGH
Non-HDL Cholesterol (Calc): 167 mg/dL (calc) — ABNORMAL HIGH (ref <130)
Total CHOL/HDL Ratio: 3.3 (calc) (ref <5.0)
Triglycerides: 140 mg/dL (ref <150)

## 2019-05-15 LAB — VITAMIN D 25 HYDROXY (VIT D DEFICIENCY, FRACTURES): Vit D, 25-Hydroxy: 17 ng/mL — ABNORMAL LOW (ref 30–100)

## 2019-05-15 LAB — HIV ANTIBODY (ROUTINE TESTING W REFLEX): HIV 1&2 Ab, 4th Generation: NONREACTIVE

## 2019-05-15 LAB — RPR: RPR Ser Ql: NONREACTIVE

## 2019-05-15 LAB — HEMOGLOBIN A1C
Hgb A1c MFr Bld: 4.9 % of total Hgb (ref <5.7)
Mean Plasma Glucose: 94 (calc)
eAG (mmol/L): 5.2 (calc)

## 2019-05-23 ENCOUNTER — Other Ambulatory Visit: Payer: Self-pay | Admitting: Family Medicine

## 2019-05-23 ENCOUNTER — Encounter: Payer: Self-pay | Admitting: Family Medicine

## 2019-05-23 MED ORDER — VITAMIN D (ERGOCALCIFEROL) 1.25 MG (50000 UNIT) PO CAPS: 50000.0000 [IU] | ORAL_CAPSULE | ORAL | 0 refills | Status: DC

## 2019-08-09 ENCOUNTER — Other Ambulatory Visit: Payer: Self-pay | Admitting: Family Medicine

## 2019-08-24 ENCOUNTER — Encounter: Payer: Self-pay | Admitting: Family Medicine

## 2019-08-25 ENCOUNTER — Other Ambulatory Visit: Payer: Self-pay | Admitting: Family Medicine

## 2020-01-02 ENCOUNTER — Encounter: Payer: Self-pay | Admitting: Family Medicine

## 2020-05-18 ENCOUNTER — Encounter: Payer: Self-pay | Admitting: Family Medicine

## 2020-05-18 ENCOUNTER — Other Ambulatory Visit: Payer: Self-pay

## 2020-05-18 ENCOUNTER — Ambulatory Visit (INDEPENDENT_AMBULATORY_CARE_PROVIDER_SITE_OTHER): Payer: 59 | Admitting: Family Medicine

## 2020-05-18 ENCOUNTER — Other Ambulatory Visit (HOSPITAL_COMMUNITY)
Admission: RE | Admit: 2020-05-18 | Discharge: 2020-05-18 | Disposition: A | Discharge status: Home / Self Care | Payer: 59 | Source: Ambulatory Visit | Attending: Family Medicine | Admitting: Family Medicine

## 2020-05-18 VITALS — BP 118/78 | HR 95 | Temp 97.8°F | Resp 16 | Ht 69.0 in | Wt 201.2 lb

## 2020-05-18 DIAGNOSIS — Z124 Encounter for screening for malignant neoplasm of cervix: Secondary | ICD-10-CM

## 2020-05-18 DIAGNOSIS — Z113 Encounter for screening for infections with a predominantly sexual mode of transmission: Secondary | ICD-10-CM

## 2020-05-18 DIAGNOSIS — Z3041 Encounter for surveillance of contraceptive pills: Secondary | ICD-10-CM

## 2020-05-18 DIAGNOSIS — K760 Fatty (change of) liver, not elsewhere classified: Secondary | ICD-10-CM | POA: Diagnosis not present

## 2020-05-18 DIAGNOSIS — E88819 Insulin resistance, unspecified: Secondary | ICD-10-CM

## 2020-05-18 DIAGNOSIS — N920 Excessive and frequent menstruation with regular cycle: Secondary | ICD-10-CM

## 2020-05-18 DIAGNOSIS — E8881 Metabolic syndrome: Secondary | ICD-10-CM

## 2020-05-18 DIAGNOSIS — Z01419 Encounter for gynecological examination (general) (routine) without abnormal findings: Secondary | ICD-10-CM | POA: Diagnosis not present

## 2020-05-18 DIAGNOSIS — E559 Vitamin D deficiency, unspecified: Secondary | ICD-10-CM

## 2020-05-18 DIAGNOSIS — E785 Hyperlipidemia, unspecified: Secondary | ICD-10-CM

## 2020-05-18 MED ORDER — DROSPIRENONE-ETHINYL ESTRADIOL 3-0.03 MG PO TABS: 1.0000 | ORAL_TABLET | Freq: Every day | ORAL | 4 refills | Status: DC

## 2020-05-18 NOTE — Progress Notes (Signed)
Name: Vicki Arnold   MRN: [id number]    DOB: 1991/02/24   Date:05/18/2020       Progress Note  Subjective  Chief Complaint  Chief Complaint  Patient presents with  . Annual Exam    HPI  Patient presents for annual CPE.  Diet: packs lunch for work, eats at home most meals. She snacks on chips.  Exercise: she is only exercising a few times a week, discussed increasing to 150 minutes per week   USPSTF grade A and B recommendations    Office Visit from 05/18/2020 in [facility]  AUDIT-C Score  1     Depression: Phq 9 is  negative Depression screen [facility] 2/9 05/18/2020 05/14/2019 11/22/2018 04/18/2018 02/06/2017  Decreased Interest 0 0 0 0 0  Down, Depressed, Hopeless 0 0 0 0 0  PHQ - 2 Score 0 0 0 0 0  Altered sleeping 0 0 0 - -  Tired, decreased energy 0 0 0 - -  Change in appetite 0 0 0 - -  Feeling bad or failure about yourself  0 0 0 - -  Trouble concentrating 0 0 0 - -  Moving slowly or fidgety/restless 0 0 0 - -  Suicidal thoughts 0 0 0 - -  PHQ-9 Score 0 0 0 - -  Difficult doing work/chores Not difficult at all Not difficult at all Not difficult at all - -   Hypertension: BP Readings from Last 3 Encounters:  05/18/20 118/78  05/14/19 126/84  11/22/18 112/66   Obesity: Wt Readings from Last 3 Encounters:  05/18/20 201 lb 3.2 oz (91.3 kg)  05/14/19 192 lb 14.4 oz (87.5 kg)  11/22/18 197 lb 12.8 oz (89.7 kg)   BMI Readings from Last 3 Encounters:  05/18/20 29.71 kg/m  05/14/19 28.49 kg/m  11/22/18 30.98 kg/m     Hep C Screening: up to date  STD testing and prevention (HIV/chl/gon/syphilis): today  Intimate partner violence: negative screen  Sexual History (Partners/Practices/Protection from [employer] hx STI/Pregnancy Plans): she had 1 sexual partner in the past year, female, uses condoms, she does not want to get pregnant in the next year  Pain during Intercourse: no pain  Menstrual History/LMP/Abnormal Bleeding: heavy cycles, regular on ocp   Incontinence Symptoms: no problems   Breast cancer:  - Last Mammogram: start at age 89  - BRCA gene screening: N/A  Osteoporosis: Discussed high calcium and vitamin D supplementation, weight bearing exercises  Cervical cancer screening: recheck pap today, new sexual partner   Skin cancer: Discussed monitoring for atypical lesions  Colorectal cancer: start at age 56     Advanced Care Planning: A voluntary discussion about advance care planning including the explanation and discussion of advance directives.  Discussed health care proxy and Living will, and the patient was able to identify a health care proxy as parents .  Patient does not have a living will at present time. If patient does have living will, I have requested they bring this to the clinic to be scanned in to their chart.  Lipids: Lab Results  Component Value Date   CHOL 241 (H) 05/14/2019   CHOL 177 04/18/2018   CHOL 206 (H) 02/07/2017   Lab Results  Component Value Date   HDL 74 05/14/2019   HDL 65 04/18/2018   HDL 66 02/07/2017   Lab Results  Component Value Date   LDLCALC 141 (H) 05/14/2019   LDLCALC 98 04/18/2018   LDLCALC 124 (H) 02/07/2017  Lab Results  Component Value Date   TRIG 140 05/14/2019   TRIG 59 04/18/2018   TRIG 78 02/07/2017   Lab Results  Component Value Date   CHOLHDL 3.3 05/14/2019   CHOLHDL 2.7 04/18/2018   CHOLHDL 3.1 02/07/2017   No results found for: LDLDIRECT  Glucose: Glucose  Date Value Ref Range Status  02/07/2017 85 65 - 99 mg/dL Final  01/05/2016 95 65 - 99 mg/dL Final  07/10/2014 115 (H) 65 - 99 mg/dL Final   Glucose, Bld  Date Value Ref Range Status  05/14/2019 83 65 - 99 mg/dL Final    Comment:    .            Fasting reference interval .   04/18/2018 87 65 - 99 mg/dL Final    Comment:    .            Fasting reference interval .     Patient Active Problem List   Diagnosis Date Noted  . Insulin resistance 04/18/2018  . Latent tuberculosis by  blood test 10/11/2017  . 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 Angio-edema 12/24/2015  . Fatty infiltration of liver 12/24/2015  . Hive 12/24/2015  . Hypertriglyceridemia 12/24/2015  . Vitamin D deficiency 12/24/2015  . Performance anxiety 12/24/2015  . Obesity (BMI 30.0-34.9) 09/17/2014    Past Surgical History:  Procedure Laterality Date  . BREAST ENHANCEMENT SURGERY Bilateral 05/28/2014   Reduction  . CHOLECYSTECTOMY  08/02/2011   Lap-Dr. Fleet Contras  . WISDOM TOOTH EXTRACTION      Family History  Problem Relation Age of Onset  . Diabetes Mother        Pre-Diabetic  . Hypertension Mother   . Hemochromatosis Mother   . Hypertension Father   . Diabetes Father     Social History   Socioeconomic History  . Marital status: Single    Spouse name: Not on file  . Number of children: 0  . Years of education: Not on file  . Highest education level: Bachelor's degree (e.g., BA, AB, BS)  Occupational History  . Occupation: Therapist, sports    Comment: [place]  Tobacco Use  . Smoking status: Never Smoker  . Smokeless tobacco: Never Used  Substance and Sexual Activity  . Alcohol use: Yes    Alcohol/week: 0.0 standard drinks    Comment: occasionally  . Drug use: No  . Sexual activity: Yes    Partners: Male    Birth control/protection: Condom  Other Topics Concern  . 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 Difficulty of Paying Living Expenses: Not hard at all  Food Insecurity: No Food Insecurity  . Worried About Charity fundraiser in the Last Year: Never true  . Ran Out of Food in the Last Year: Never true  Transportation Needs: No Transportation Needs  . Lack of Transportation (Medical): No  . Lack of Transportation (Non-Medical): No  Physical Activity: Insufficiently Active  . Days of Exercise per  Week: 3 days  . Minutes of Exercise per Session: 30 min  Stress: No Stress Concern Present  . Feeling of Stress : Not at all  Social Connections: Moderately Isolated  . Frequency of Communication with Friends and Family: More than three times a week  . Frequency of Social  Gatherings with Friends and Family: More than three times a week  . Attends Religious Services: Never  . Active Member of Clubs or Organizations: No  . Attends Archivist Meetings: Never  . Marital Status: Never married  Intimate Partner Violence: Not At Risk  . Fear of Current or Ex-Partner: No  . Emotionally Abused: No  . Physically Abused: No  . Sexually Abused: No     Current Outpatient Medications:  .  diphenhydrAMINE (BENADRYL) 25 mg capsule, Take by mouth as needed., Disp: , Rfl:  .  docusate sodium (COLACE) 100 MG capsule, , Disp: , Rfl:  .  drospirenone-ethinyl estradiol (OCELLA) 3-0.03 MG tablet, Take 1 tablet by mouth daily., Disp: 3 Package, Rfl: 4  Allergies  Allergen Reactions  . Penicillins Hives     ROS  Constitutional: Negative for fever or weight change.  Respiratory: Negative for cough and shortness of breath.   Cardiovascular: Negative for chest pain or palpitations.  Gastrointestinal: Negative for abdominal pain, no bowel changes.  Musculoskeletal: Negative for gait problem or joint swelling.  Skin: Negative for rash.  Neurological: Negative for dizziness or headache.  No other specific complaints in a complete review of systems (except as listed in HPI above).  Objective  Vitals:   05/18/20 0803  BP: 118/78  Pulse: 95  Resp: 16  Temp: 97.8 F (36.6 C)  TempSrc: Temporal  SpO2: 98%  Weight: 201 lb 3.2 oz (91.3 kg)  Height: '5\' 9"'  (1.753 m)    Body mass index is 29.71 kg/m.  Physical Exam   Constitutional: Patient appears well-developed and well-nourished. No distress.  HENT: Head: Normocephalic and atraumatic. Ears: B TMs ok, no erythema or effusion; Nose:  Not done. Mouth/Throat: not done  Eyes: Conjunctivae and EOM are normal. Pupils are equal, round, and reactive to light. No scleral icterus.  Neck: Normal range of motion. Neck supple. No JVD present. No thyromegaly present.  Cardiovascular: Normal rate, regular rhythm and normal heart sounds.  No murmur heard. No BLE edema. Pulmonary/Chest: Effort normal and breath sounds normal. No respiratory distress. Abdominal: Soft. Bowel sounds are normal, no distension. There is no tenderness. no masses Breast: no lumps or masses, no nipple discharge or rashes FEMALE GENITALIA:  External genitalia normal External urethra normal Vaginal vault normal without discharge or lesions Cervix normal without discharge or lesions Bimanual exam normal without masses RECTAL: not done  Musculoskeletal: Normal range of motion, no joint effusions. No gross deformities Neurological: he is alert and oriented to person, place, and time. No cranial nerve deficit. Coordination, balance, strength, speech and gait are normal.  Skin: Skin is warm and dry. No rash noted. No erythema.  Psychiatric: Patient has a normal mood and affect. behavior is normal. Judgment and thought content normal.  Fall Risk: Fall Risk  05/18/2020 05/14/2019 11/22/2018 04/18/2018 02/06/2017  Falls in the past year? 0 0 0 No No  Number falls in past yr: 0 0 - - -  Injury with Fall? 0 0 - - -  Follow up Falls evaluation completed - - - -     Functional Status Survey: Is the patient deaf or have difficulty hearing?: No Does the patient have difficulty seeing, even when wearing glasses/contacts?: No Does the patient have difficulty concentrating, remembering, or making decisions?: No Does the patient have difficulty walking or climbing stairs?: No Does the patient have difficulty dressing or bathing?: No Does the patient have difficulty doing errands alone such as visiting a doctor's office or  shopping?: No   Assessment & Plan   1. Well  woman exam  - COMPLETE METABOLIC PANEL WITH GFR  2. Insulin resistance  - Hemoglobin A1c  3. Dyslipidemia  - Lipid panel  4. Fatty infiltration of liver  - COMPLETE METABOLIC PANEL WITH GFR  5. Menorrhagia with regular cycle  - CBC with Differential/Platelet  6. Cervical cancer screening  - Cytology - PAP  7. Vitamin D deficiency  - VITAMIN D 25 Hydroxy (Vit-D Deficiency, Fractures)  8. Routine screening for STI (sexually transmitted infection)  - HIV Antibody (routine testing w rflx) - RPR  9. Encounter for surveillance of contraceptive pills  - drospirenone-ethinyl estradiol (OCELLA) 3-0.03 MG tablet; Take 1 tablet by mouth daily.  Dispense: 3 Package; Refill: 4  -USPSTF grade A and B recommendations reviewed with patient; age-appropriate recommendations, preventive care, screening tests, etc discussed and encouraged; healthy living encouraged; see AVS for patient education given to patient -Discussed importance of 150 minutes of physical activity weekly, eat two servings of fish weekly, eat one serving of tree nuts ( cashews, pistachios, pecans, almonds.Marland Kitchen) every other day, eat 6 servings of fruit/vegetables daily and drink plenty of water and avoid sweet beverages.

## 2020-05-18 NOTE — Patient Instructions (Signed)
Preventive Care 21-29 Years Old, Female Preventive care refers to visits with your health care provider and lifestyle choices that can promote health and wellness. This includes:  A yearly physical exam. This may also be called an annual well check.  Regular dental visits and eye exams.  Immunizations.  Screening for certain conditions.  Healthy lifestyle choices, such as eating a healthy diet, getting regular exercise, not using drugs or products that contain nicotine and tobacco, and limiting alcohol use. What can I expect for my preventive care visit? Physical exam Your health care provider will check your:  Height and weight. This may be used to calculate body mass index (BMI), which tells if you are at a healthy weight.  Heart rate and blood pressure.  Skin for abnormal spots. Counseling Your health care provider may ask you questions about your:  Alcohol, tobacco, and drug use.  Emotional well-being.  Home and relationship well-being.  Sexual activity.  Eating habits.  Work and work environment.  Method of birth control.  Menstrual cycle.  Pregnancy history. What immunizations do I need?  Influenza (flu) vaccine  This is recommended every year. Tetanus, diphtheria, and pertussis (Tdap) vaccine  You may need a Td booster every 10 years. Varicella (chickenpox) vaccine  You may need this if you have not been vaccinated. Human papillomavirus (HPV) vaccine  If recommended by your health care provider, you may need three doses over 6 months. Measles, mumps, and rubella (MMR) vaccine  You may need at least one dose of MMR. You may also need a second dose. Meningococcal conjugate (MenACWY) vaccine  One dose is recommended if you are age 19-21 years and a first-year college student living in a residence hall, or if you have one of several medical conditions. You may also need additional booster doses. Pneumococcal conjugate (PCV13) vaccine  You may need  this if you have certain conditions and were not previously vaccinated. Pneumococcal polysaccharide (PPSV23) vaccine  You may need one or two doses if you smoke cigarettes or if you have certain conditions. Hepatitis A vaccine  You may need this if you have certain conditions or if you travel or work in places where you may be exposed to hepatitis A. Hepatitis B vaccine  You may need this if you have certain conditions or if you travel or work in places where you may be exposed to hepatitis B. Haemophilus influenzae type b (Hib) vaccine  You may need this if you have certain conditions. You may receive vaccines as individual doses or as more than one vaccine together in one shot (combination vaccines). Talk with your health care provider about the risks and benefits of combination vaccines. What tests do I need?  Blood tests  Lipid and cholesterol levels. These may be checked every 5 years starting at age 20.  Hepatitis C test.  Hepatitis B test. Screening  Diabetes screening. This is done by checking your blood sugar (glucose) after you have not eaten for a while (fasting).  Sexually transmitted disease (STD) testing.  BRCA-related cancer screening. This may be done if you have a family history of breast, ovarian, tubal, or peritoneal cancers.  Pelvic exam and Pap test. This may be done every 3 years starting at age 21. Starting at age 30, this may be done every 5 years if you have a Pap test in combination with an HPV test. Talk with your health care provider about your test results, treatment options, and if necessary, the need for more tests.   Follow these instructions at home: Eating and drinking   Eat a diet that includes fresh fruits and vegetables, whole grains, lean protein, and low-fat dairy.  Take vitamin and mineral supplements as recommended by your health care provider.  Do not drink alcohol if: ? Your health care provider tells you not to drink. ? You are  pregnant, may be pregnant, or are planning to become pregnant.  If you drink alcohol: ? Limit how much you have to 0-1 drink a day. ? Be aware of how much alcohol is in your drink. In the [place] one drink equals one 12 oz bottle of beer (355 mL), one 5 oz glass of wine (148 mL), or one 1 oz glass of hard liquor (44 mL). Lifestyle  Take daily care of your teeth and gums.  Stay active. Exercise for at least 30 minutes on 5 or more days each week.  Do not use any products that contain nicotine or tobacco, such as cigarettes, e-cigarettes, and chewing tobacco. If you need help quitting, ask your health care provider.  If you are sexually active, practice safe sex. Use a condom or other form of birth control (contraception) in order to prevent pregnancy and STIs (sexually transmitted infections). If you plan to become pregnant, see your health care provider for a preconception visit. What's next?  Visit your health care provider once a year for a well check visit.  Ask your health care provider how often you should have your eyes and teeth checked.  Stay up to date on all vaccines. This information is not intended to replace advice given to you by your health care provider. Make sure you discuss any questions you have with your health care provider. Document Revised: 08/22/2018 Document Reviewed: 08/22/2018 Elsevier Patient Education  2020 [employer].

## 2020-05-19 LAB — COMPLETE METABOLIC PANEL WITH GFR
AG Ratio: 1.9 (calc) (ref 1.0–2.5)
ALT: 46 U/L — ABNORMAL HIGH (ref 6–29)
AST: 43 U/L — ABNORMAL HIGH (ref 10–30)
Albumin: 4.3 g/dL (ref 3.6–5.1)
Alkaline phosphatase (APISO): 36 U/L (ref 31–125)
BUN: 9 mg/dL (ref 7–25)
CO2: 25 mmol/L (ref 20–32)
Calcium: 9.6 mg/dL (ref 8.6–10.2)
Chloride: 105 mmol/L (ref 98–110)
Creat: 0.75 mg/dL (ref 0.50–1.10)
GFR, Est African American: 126 mL/min/{1.73_m2} (ref >=60)
GFR, Est Non African American: 108 mL/min/{1.73_m2} (ref >=60)
Globulin: 2.3 g/dL (calc) (ref 1.9–3.7)
Glucose, Bld: 87 mg/dL (ref 65–99)
Potassium: 4.5 mmol/L (ref 3.5–5.3)
Sodium: 138 mmol/L (ref 135–146)
Total Bilirubin: 0.4 mg/dL (ref 0.2–1.2)
Total Protein: 6.6 g/dL (ref 6.1–8.1)

## 2020-05-19 LAB — LIPID PANEL
Cholesterol: 203 mg/dL — ABNORMAL HIGH (ref <200)
HDL: 79 mg/dL (ref >=50)
LDL Cholesterol (Calc): 101 mg/dL (calc) — ABNORMAL HIGH
Non-HDL Cholesterol (Calc): 124 mg/dL (calc) (ref <130)
Total CHOL/HDL Ratio: 2.6 (calc) (ref <5.0)
Triglycerides: 130 mg/dL (ref <150)

## 2020-05-19 LAB — CBC WITH DIFFERENTIAL/PLATELET
Absolute Monocytes: 359 cells/uL (ref 200–950)
Basophils Absolute: 51 cells/uL (ref 0–200)
Basophils Relative: 0.9 %
Eosinophils Absolute: 23 cells/uL (ref 15–500)
Eosinophils Relative: 0.4 %
HCT: 40.3 % (ref 35.0–45.0)
Hemoglobin: 13.6 g/dL (ref 11.7–15.5)
Lymphs Abs: 2468 cells/uL (ref 850–3900)
MCH: 31.1 pg (ref 27.0–33.0)
MCHC: 33.7 g/dL (ref 32.0–36.0)
MCV: 92.2 fL (ref 80.0–100.0)
MPV: 10.3 fL (ref 7.5–12.5)
Monocytes Relative: 6.3 %
Neutro Abs: 2799 cells/uL (ref 1500–7800)
Neutrophils Relative %: 49.1 %
Platelets: 356 10*3/uL (ref 140–400)
RBC: 4.37 10*6/uL (ref 3.80–5.10)
RDW: 12.3 % (ref 11.0–15.0)
Total Lymphocyte: 43.3 %
WBC: 5.7 10*3/uL (ref 3.8–10.8)

## 2020-05-19 LAB — RPR: RPR Ser Ql: NONREACTIVE

## 2020-05-19 LAB — CYTOLOGY - PAP
Chlamydia: NEGATIVE
Comment: NEGATIVE
Comment: NORMAL
Diagnosis: NEGATIVE
Neisseria Gonorrhea: NEGATIVE

## 2020-05-19 LAB — HIV ANTIBODY (ROUTINE TESTING W REFLEX): HIV 1&2 Ab, 4th Generation: NONREACTIVE

## 2020-05-19 LAB — HEMOGLOBIN A1C
Hgb A1c MFr Bld: 4.9 % of total Hgb (ref <5.7)
Mean Plasma Glucose: 94 (calc)
eAG (mmol/L): 5.2 (calc)

## 2020-05-19 LAB — VITAMIN D 25 HYDROXY (VIT D DEFICIENCY, FRACTURES): Vit D, 25-Hydroxy: 29 ng/mL — ABNORMAL LOW (ref 30–100)

## 2021-05-18 ENCOUNTER — Other Ambulatory Visit: Payer: Self-pay | Admitting: Family Medicine

## 2021-05-18 DIAGNOSIS — Z3041 Encounter for surveillance of contraceptive pills: Secondary | ICD-10-CM

## 2021-05-18 NOTE — Telephone Encounter (Signed)
Requested Prescriptions  Pending Prescriptions Disp Refills  . SYEDA 3-0.03 MG tablet [Pharmacy Med Name: DROSPIR/ETH EST (SYEDA) TABS 28'S 3MG /30MCG] 84 tablet 0    Sig: TAKE 1 TABLET DAILY     OB/GYN:  Contraceptives Failed - 05/18/2021  1:04 AM      Failed - Valid encounter within last 12 months    Recent Outpatient Visits          1 year ago Well woman exam   [facility] Steele Sizer, MD   2 years ago Well woman exam   [facility] Steele Sizer, MD   2 years ago Disorder of tear duct system   [facility] Steele Sizer, MD   3 years ago Well woman exam   [facility] Steele Sizer, MD   3 years ago Hemochromatosis carrier   [place] [place]      Future Appointments            In 6 days Steele Sizer, MD [facility] [place] BP in normal range    BP Readings from Last 1 Encounters:  05/18/20 118/78

## 2021-05-19 NOTE — Patient Instructions (Signed)
Preventive Care 21-30 Years Old, Female Preventive care refers to lifestyle choices and visits with your health care provider that can promote health and wellness. This includes:  A yearly physical exam. This is also called an annual wellness visit.  Regular dental and eye exams.  Immunizations.  Screening for certain conditions.  Healthy lifestyle choices, such as: ? Eating a healthy diet. ? Getting regular exercise. ? Not using drugs or products that contain nicotine and tobacco. ? Limiting alcohol use. What can I expect for my preventive care visit? Physical exam Your health care provider may check your:  Height and weight. These may be used to calculate your BMI (body mass index). BMI is a measurement that tells if you are at a healthy weight.  Heart rate and blood pressure.  Body temperature.  Skin for abnormal spots. Counseling Your health care provider may ask you questions about your:  Past medical problems.  Family's medical history.  Alcohol, tobacco, and drug use.  Emotional well-being.  Home life and relationship well-being.  Sexual activity.  Diet, exercise, and sleep habits.  Work and work environment.  Access to firearms.  Method of birth control.  Menstrual cycle.  Pregnancy history. What immunizations do I need? Vaccines are usually given at various ages, according to a schedule. Your health care provider will recommend vaccines for you based on your age, medical history, and lifestyle or other factors, such as travel or where you work.   What tests do I need? Blood tests  Lipid and cholesterol levels. These may be checked every 5 years starting at age 20.  Hepatitis C test.  Hepatitis B test. Screening  Diabetes screening. This is done by checking your blood sugar (glucose) after you have not eaten for a while (fasting).  STD (sexually transmitted disease) testing, if you are at risk.  BRCA-related cancer screening. This may be  done if you have a family history of breast, ovarian, tubal, or peritoneal cancers.  Pelvic exam and Pap test. This may be done every 3 years starting at age 21. Starting at age 30, this may be done every 5 years if you have a Pap test in combination with an HPV test. Talk with your health care provider about your test results, treatment options, and if necessary, the need for more tests.   Follow these instructions at home: Eating and drinking  Eat a healthy diet that includes fresh fruits and vegetables, whole grains, lean protein, and low-fat dairy products.  Take vitamin and mineral supplements as recommended by your health care provider.  Do not drink alcohol if: ? Your health care provider tells you not to drink. ? You are pregnant, may be pregnant, or are planning to become pregnant.  If you drink alcohol: ? Limit how much you have to 0-1 drink a day. ? Be aware of how much alcohol is in your drink. In the [place] one drink equals one 12 oz bottle of beer (355 mL), one 5 oz glass of wine (148 mL), or one 1 oz glass of hard liquor (44 mL).   Lifestyle  Take daily care of your teeth and gums. Brush your teeth every morning and night with fluoride toothpaste. Floss one time each day.  Stay active. Exercise for at least 30 minutes 5 or more days each week.  Do not use any products that contain nicotine or tobacco, such as cigarettes, e-cigarettes, and chewing tobacco. If you need help quitting, ask your health care provider.  Do not   use drugs.  If you are sexually active, practice safe sex. Use a condom or other form of protection to prevent STIs (sexually transmitted infections).  If you do not wish to become pregnant, use a form of birth control. If you plan to become pregnant, see your health care provider for a prepregnancy visit.  Find healthy ways to cope with stress, such as: ? Meditation, yoga, or listening to music. ? Journaling. ? Talking to a trusted  person. ? Spending time with friends and family. Safety  Always wear your seat belt while driving or riding in a vehicle.  Do not drive: ? If you have been drinking alcohol. Do not ride with someone who has been drinking. ? When you are tired or distracted. ? While texting.  Wear a helmet and other protective equipment during sports activities.  If you have firearms in your house, make sure you follow all gun safety procedures.  Seek help if you have been physically or sexually abused. What's next?  Go to your health care provider once a year for an annual wellness visit.  Ask your health care provider how often you should have your eyes and teeth checked.  Stay up to date on all vaccines. This information is not intended to replace advice given to you by your health care provider. Make sure you discuss any questions you have with your health care provider. Document Revised: 08/08/2020 Document Reviewed: 08/22/2018 Elsevier Patient Education  2021 [employer]vier Inc[employer]  

## 2021-05-19 NOTE — Progress Notes (Signed)
Name: Vicki Arnold   MRN: [id number]    DOB: 1991-08-18   Date:05/24/2021       Progress Note  Subjective  Chief Complaint  Annual Exam  HPI  Patient presents for annual CPE.  Dysthymia: she states feeling a little down lately, lack of motivation, does not like her body image ( she thinks her stomach is big)  She is able to go to work. She had therapy in the past for depression and it helped. She also worries about turning 30 years.   Dyslipidemia: discussed healthy life style   Diet: discussed healthy meals.  Exercise: discussed 150 minutes of activity   [facility] Visit from 05/24/2021 in [facility]  AUDIT-C Score 1     Depression: Phq 9 is  positive Depression screen [facility] 2/9 05/24/2021 05/18/2020 05/14/2019 11/22/2018 04/18/2018  Decreased Interest 0 0 0 0 0  Down, Depressed, Hopeless 1 0 0 0 0  PHQ - 2 Score 1 0 0 0 0  Altered sleeping 1 0 0 0 -  Tired, decreased energy 1 0 0 0 -  Change in appetite 0 0 0 0 -  Feeling bad or failure about yourself  1 0 0 0 -  Trouble concentrating 0 0 0 0 -  Moving slowly or fidgety/restless 0 0 0 0 -  Suicidal thoughts 0 0 0 0 -  PHQ-9 Score 4 0 0 0 -  Difficult doing work/chores - Not difficult at all Not difficult at all Not difficult at all -   Hypertension: BP Readings from Last 3 Encounters:  05/24/21 132/78  05/18/20 118/78  05/14/19 126/84   Obesity: Wt Readings from Last 3 Encounters:  05/24/21 205 lb (93 kg)  05/18/20 201 lb 3.2 oz (91.3 kg)  05/14/19 192 lb 14.4 oz (87.5 kg)   BMI Readings from Last 3 Encounters:  05/24/21 30.27 kg/m  05/18/20 29.71 kg/m  05/14/19 28.49 kg/m     Vaccines:  Pneumonia: educated and discussed with patient. Flu: educated and discussed with patient.  Hep C Screening: 05/14/19  STD testing and prevention (HIV/chl/gon/syphilis): 05/18/20 Intimate partner violence: negative Sexual History : she had a new sexual partner and we will recheck pap today   Menstrual History/LMP/Abnormal Bleeding: regular , takes ocp and uses condoms  Incontinence Symptoms: no problems   Breast cancer:  - Last Mammogram: N/A - BRCA gene screening: two cousins had breast cancer - diagnosed in their 40's   Osteoporosis: Discussed high calcium and vitamin D supplementation, weight bearing exercises  Cervical cancer screening: 05/18/20  Skin cancer: Discussed monitoring for atypical lesions  ECG: 07/10/14  Advanced Care Planning: A voluntary discussion about advance care planning including the explanation and discussion of advance directives.  Discussed health care proxy and Living will, and the patient was able to identify a health care proxy as mother     Lipids: Lab Results  Component Value Date   CHOL 203 (H) 05/18/2020   CHOL 241 (H) 05/14/2019   CHOL 177 04/18/2018   Lab Results  Component Value Date   HDL 79 05/18/2020   HDL 74 05/14/2019   HDL 65 04/18/2018   Lab Results  Component Value Date   LDLCALC 101 (H) 05/18/2020   LDLCALC 141 (H) 05/14/2019   LDLCALC 98 04/18/2018   Lab Results  Component Value Date   TRIG 130 05/18/2020   TRIG 140 05/14/2019   TRIG 59 04/18/2018   Lab Results  Component Value Date  CHOLHDL 2.6 05/18/2020   CHOLHDL 3.3 05/14/2019   CHOLHDL 2.7 04/18/2018   No results found for: LDLDIRECT  Glucose: Glucose  Date Value Ref Range Status  07/10/2014 115 (H) 65 - 99 mg/dL Final   Glucose, Bld  Date Value Ref Range Status  05/18/2020 87 65 - 99 mg/dL Final    Comment:    .            Fasting reference interval .   05/14/2019 83 65 - 99 mg/dL Final    Comment:    .            Fasting reference interval .   04/18/2018 87 65 - 99 mg/dL Final    Comment:    .            Fasting reference interval .     Patient Active Problem List   Diagnosis Date Noted  . Insulin resistance 04/18/2018  . Latent tuberculosis by blood test 10/11/2017  . 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 Angio-edema  12/24/2015  . Fatty infiltration of liver 12/24/2015  . Hive 12/24/2015  . Hypertriglyceridemia 12/24/2015  . Vitamin D deficiency 12/24/2015  . Performance anxiety 12/24/2015  . Obesity (BMI 30.0-34.9) 09/17/2014    Past Surgical History:  Procedure Laterality Date  . BREAST ENHANCEMENT SURGERY Bilateral 05/28/2014   Reduction  . CHOLECYSTECTOMY  08/02/2011   Lap-Dr. Fleet Contras  . WISDOM TOOTH EXTRACTION      Family History  Problem Relation Age of Onset  . Diabetes Mother        Pre-Diabetic  . Hypertension Mother   . Hemochromatosis Mother   . Hypertension Father   . Diabetes Father     Social History   Socioeconomic History  . Marital status: Single    Spouse name: Not on file  . Number of children: 0  . Years of education: Not on file  . Highest education level: Bachelor's degree (e.g., BA, AB, BS)  Occupational History  . Occupation: Therapist, sports    Comment: [employer]  Tobacco Use  . Smoking status: Never Smoker  . Smokeless tobacco: Never Used  Vaping Use  . Vaping Use: Never used  Substance and Sexual Activity  . Alcohol use: Yes    Alcohol/week: 0.0 standard drinks    Comment: occasionally  . Drug use: No  . Sexual activity: Yes    Partners: Male    Birth control/protection: Condom  Other Topics Concern  . 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 Difficulty of Paying Living Expenses: Not hard at all  Food Insecurity: No Food Insecurity  . Worried About Charity fundraiser in the Last Year: Never true  . Ran Out of Food in the Last Year: Never true  Transportation Needs: No Transportation Needs  . Lack of Transportation (Medical): No  . Lack of Transportation (Non-Medical): No  Physical Activity: Inactive  . Days of Exercise per Week: 0 days  . Minutes of Exercise per Session: 0  min  Stress: No Stress Concern Present  . Feeling of Stress : Only a little  Social Connections: Moderately Isolated  . Frequency of Communication with Friends and Family: Twice a week  . Frequency of Social Gatherings with Friends and Family: More than three times a  week  . Attends Religious Services: Never  . Active Member of Clubs or Organizations: Yes  . Attends Archivist Meetings: Never  . Marital Status: Never married  Intimate Partner Violence: Not At Risk  . Fear of Current or Ex-Partner: No  . Emotionally Abused: No  . Physically Abused: No  . Sexually Abused: No     Current Outpatient Medications:  .  diphenhydrAMINE (BENADRYL) 25 mg capsule, Take by mouth as needed., Disp: , Rfl:  .  docusate sodium (COLACE) 100 MG capsule, as needed., Disp: , Rfl:  .  pantoprazole (PROTONIX) 20 MG tablet, Take 1 tablet by mouth daily., Disp: , Rfl:  .  SYEDA 3-0.03 MG tablet, TAKE 1 TABLET DAILY, Disp: 84 tablet, Rfl: 0  Allergies  Allergen Reactions  . Penicillins Hives     ROS  Constitutional: Negative for fever, positive for  weight change.  Respiratory: Negative for cough and shortness of breath.   Cardiovascular: Negative for chest pain or palpitations.  Gastrointestinal: Negative for abdominal pain, no bowel changes.  Musculoskeletal: Negative for gait problem or joint swelling.  Skin: Negative for rash.  Neurological: Negative for dizziness or headache.  No other specific complaints in a complete review of systems (except as listed in HPI above).  Objective  Vitals:   05/24/21 1026  BP: 132/78  Pulse: 99  Resp: 16  Temp: 98.4 F (36.9 C)  TempSrc: Oral  SpO2: 99%  Weight: 205 lb (93 kg)  Height: '5\' 9"'  (1.753 m)    Body mass index is 30.27 kg/m.  Physical Exam  Constitutional: Patient appears well-developed and well-nourished. No distress.  HENT: Head: Normocephalic and atraumatic. Ears: B TMs ok, no erythema or effusion; Nose: not done  Mouth/Throat: not done  Eyes: Conjunctivae and EOM are normal. Pupils are equal, round, and reactive to light. No scleral icterus.  Neck: Normal range of motion. Neck supple. No JVD present. No thyromegaly present.  Cardiovascular: Normal rate, regular rhythm and normal heart sounds.  No murmur heard. No BLE edema. Pulmonary/Chest: Effort normal and breath sounds normal. No respiratory distress. Abdominal: Soft. Bowel sounds are normal, no distension. There is no tenderness. no masses Breast: no lumps or masses, no nipple discharge or rashes, well healed scar from breast reduction surgery  FEMALE GENITALIA:  External genitalia normal External urethra normal Vaginal vault normal without discharge or lesions Cervix normal without discharge or lesions Bimanual exam normal without masses RECTAL: not done  Musculoskeletal: Normal range of motion, no joint effusions. No gross deformities Neurological: he is alert and oriented to person, place, and time. No cranial nerve deficit. Coordination, balance, strength, speech and gait are normal.  Skin: Skin is warm and dry. No rash noted. No erythema.  Psychiatric: Patient has a normal mood and affect. behavior is normal. Judgment and thought content normal.  Fall Risk: Fall Risk  05/24/2021 05/18/2020 05/14/2019 11/22/2018 04/18/2018  Falls in the past year? 0 0 0 0 No  Number falls in past yr: 0 0 0 - -  Injury with Fall? 0 0 0 - -  Follow up - Falls evaluation completed - - -     Functional Status Survey: Is the patient deaf or have difficulty hearing?: No Does the patient have difficulty seeing, even when wearing glasses/contacts?: No Does the patient have difficulty concentrating, remembering, or making decisions?: No Does the patient have difficulty walking or climbing stairs?: No Does the patient have difficulty dressing or bathing?: No Does the patient have  difficulty doing errands alone such as visiting a doctor's office or shopping?:  No   Assessment & Plan  1. Encounter for surveillance of contraceptive pills  - drospirenone-ethinyl estradiol (SYEDA) 3-0.03 MG tablet; Take 1 tablet by mouth daily.  Dispense: 84 tablet; Refill: 3  2. Well woman exam  - CBC with Differential/Platelet - COMPLETE METABOLIC PANEL WITH GFR  3. Insulin resistance  - Hemoglobin A1c  4. Dyslipidemia   5. Fatty infiltration of liver   6. Vitamin D deficiency  - VITAMIN D 25 Hydroxy (Vit-D Deficiency, Fractures)  7. Cervical cancer screening  - Cytology - PAP  8. Routine screening for STI (sexually transmitted infection)  - Lipid panel - RPR - HIV Antibody (routine testing w rflx)  9. Family history of thyroid disease  - TSH  10. Dysthymia  Discussed therapy, not interested in medication   -USPSTF grade A and B recommendations reviewed with patient; age-appropriate recommendations, preventive care, screening tests, etc discussed and encouraged; healthy living encouraged; see AVS for patient education given to patient -Discussed importance of 150 minutes of physical activity weekly, eat two servings of fish weekly, eat one serving of tree nuts ( cashews, pistachios, pecans, almonds.Marland Kitchen) every other day, eat 6 servings of fruit/vegetables daily and drink plenty of water and avoid sweet beverages.

## 2021-05-20 ENCOUNTER — Encounter: Payer: 59 | Admitting: Family Medicine

## 2021-05-24 ENCOUNTER — Other Ambulatory Visit (HOSPITAL_COMMUNITY)
Admission: RE | Admit: 2021-05-24 | Discharge: 2021-05-24 | Disposition: A | Discharge status: Home / Self Care | Payer: 59 | Source: Ambulatory Visit | Attending: Family Medicine | Admitting: Family Medicine

## 2021-05-24 ENCOUNTER — Ambulatory Visit (INDEPENDENT_AMBULATORY_CARE_PROVIDER_SITE_OTHER): Payer: 59 | Admitting: Family Medicine

## 2021-05-24 ENCOUNTER — Encounter: Payer: Self-pay | Admitting: Family Medicine

## 2021-05-24 ENCOUNTER — Other Ambulatory Visit: Payer: Self-pay

## 2021-05-24 VITALS — BP 132/78 | HR 99 | Temp 98.4°F | Resp 16 | Ht 69.0 in | Wt 205.0 lb

## 2021-05-24 DIAGNOSIS — Z113 Encounter for screening for infections with a predominantly sexual mode of transmission: Secondary | ICD-10-CM | POA: Diagnosis not present

## 2021-05-24 DIAGNOSIS — Z01419 Encounter for gynecological examination (general) (routine) without abnormal findings: Secondary | ICD-10-CM

## 2021-05-24 DIAGNOSIS — E559 Vitamin D deficiency, unspecified: Secondary | ICD-10-CM

## 2021-05-24 DIAGNOSIS — K760 Fatty (change of) liver, not elsewhere classified: Secondary | ICD-10-CM

## 2021-05-24 DIAGNOSIS — E8881 Metabolic syndrome: Secondary | ICD-10-CM | POA: Diagnosis not present

## 2021-05-24 DIAGNOSIS — F341 Dysthymic disorder: Secondary | ICD-10-CM

## 2021-05-24 DIAGNOSIS — E88819 Insulin resistance, unspecified: Secondary | ICD-10-CM

## 2021-05-24 DIAGNOSIS — Z124 Encounter for screening for malignant neoplasm of cervix: Secondary | ICD-10-CM | POA: Insufficient documentation

## 2021-05-24 DIAGNOSIS — Z8349 Family history of other endocrine, nutritional and metabolic diseases: Secondary | ICD-10-CM

## 2021-05-24 DIAGNOSIS — Z3041 Encounter for surveillance of contraceptive pills: Secondary | ICD-10-CM

## 2021-05-24 DIAGNOSIS — E785 Hyperlipidemia, unspecified: Secondary | ICD-10-CM | POA: Diagnosis not present

## 2021-05-24 MED ORDER — DROSPIRENONE-ETHINYL ESTRADIOL 3-0.03 MG PO TABS: 1.0000 | ORAL_TABLET | Freq: Every day | ORAL | 3 refills | Status: AC

## 2021-05-25 LAB — COMPLETE METABOLIC PANEL WITH GFR
AG Ratio: 1.7 (calc) (ref 1.0–2.5)
ALT: 22 U/L (ref 6–29)
AST: 20 U/L (ref 10–30)
Albumin: 4.5 g/dL (ref 3.6–5.1)
Alkaline phosphatase (APISO): 36 U/L (ref 31–125)
BUN: 8 mg/dL (ref 7–25)
CO2: 26 mmol/L (ref 20–32)
Calcium: 9.9 mg/dL (ref 8.6–10.2)
Chloride: 104 mmol/L (ref 98–110)
Creat: 0.7 mg/dL (ref 0.50–1.10)
GFR, Est African American: 136 mL/min/{1.73_m2} (ref >=60)
GFR, Est Non African American: 117 mL/min/{1.73_m2} (ref >=60)
Globulin: 2.6 g/dL (calc) (ref 1.9–3.7)
Glucose, Bld: 87 mg/dL (ref 65–99)
Potassium: 4.3 mmol/L (ref 3.5–5.3)
Sodium: 138 mmol/L (ref 135–146)
Total Bilirubin: 0.3 mg/dL (ref 0.2–1.2)
Total Protein: 7.1 g/dL (ref 6.1–8.1)

## 2021-05-25 LAB — CBC WITH DIFFERENTIAL/PLATELET
Absolute Monocytes: 313 cells/uL (ref 200–950)
Basophils Absolute: 48 cells/uL (ref 0–200)
Basophils Relative: 0.7 %
Eosinophils Absolute: 61 cells/uL (ref 15–500)
Eosinophils Relative: 0.9 %
HCT: 44.4 % (ref 35.0–45.0)
Hemoglobin: 14.5 g/dL (ref 11.7–15.5)
Lymphs Abs: 2924 cells/uL (ref 850–3900)
MCH: 30.5 pg (ref 27.0–33.0)
MCHC: 32.7 g/dL (ref 32.0–36.0)
MCV: 93.5 fL (ref 80.0–100.0)
MPV: 10.4 fL (ref 7.5–12.5)
Monocytes Relative: 4.6 %
Neutro Abs: 3454 cells/uL (ref 1500–7800)
Neutrophils Relative %: 50.8 %
Platelets: 381 10*3/uL (ref 140–400)
RBC: 4.75 10*6/uL (ref 3.80–5.10)
RDW: 11.9 % (ref 11.0–15.0)
Total Lymphocyte: 43 %
WBC: 6.8 10*3/uL (ref 3.8–10.8)

## 2021-05-25 LAB — CYTOLOGY - PAP
Chlamydia: NEGATIVE
Comment: NEGATIVE
Comment: NEGATIVE
Comment: NORMAL
Diagnosis: NEGATIVE
High risk HPV: NEGATIVE
Neisseria Gonorrhea: NEGATIVE

## 2021-05-25 LAB — LIPID PANEL
Cholesterol: 225 mg/dL — ABNORMAL HIGH (ref <200)
HDL: 80 mg/dL (ref >=50)
LDL Cholesterol (Calc): 113 mg/dL (calc) — ABNORMAL HIGH
Non-HDL Cholesterol (Calc): 145 mg/dL (calc) — ABNORMAL HIGH (ref <130)
Total CHOL/HDL Ratio: 2.8 (calc) (ref <5.0)
Triglycerides: 201 mg/dL — ABNORMAL HIGH (ref <150)

## 2021-05-25 LAB — HEMOGLOBIN A1C
Hgb A1c MFr Bld: 5.1 % of total Hgb (ref <5.7)
Mean Plasma Glucose: 100 mg/dL
eAG (mmol/L): 5.5 mmol/L

## 2021-05-25 LAB — TSH: TSH: 2.28 mIU/L

## 2021-05-25 LAB — VITAMIN D 25 HYDROXY (VIT D DEFICIENCY, FRACTURES): Vit D, 25-Hydroxy: 15 ng/mL — ABNORMAL LOW (ref 30–100)

## 2021-05-25 LAB — RPR: RPR Ser Ql: NONREACTIVE

## 2021-05-25 LAB — HIV ANTIBODY (ROUTINE TESTING W REFLEX): HIV 1&2 Ab, 4th Generation: NONREACTIVE

## 2021-05-26 ENCOUNTER — Other Ambulatory Visit: Payer: Self-pay | Admitting: Family Medicine

## 2021-05-26 MED ORDER — VITAMIN D (ERGOCALCIFEROL) 1.25 MG (50000 UNIT) PO CAPS: 50000.0000 [IU] | ORAL_CAPSULE | ORAL | 0 refills | Status: AC

## 2021-07-06 ENCOUNTER — Encounter: Payer: Self-pay | Admitting: Family Medicine

## 2021-08-18 ENCOUNTER — Encounter: Payer: Self-pay | Admitting: Family Medicine

## 2021-08-22 ENCOUNTER — Other Ambulatory Visit: Payer: Self-pay | Admitting: Family Medicine

## 2021-08-22 ENCOUNTER — Telehealth: Payer: Self-pay

## 2021-08-22 DIAGNOSIS — F341 Dysthymic disorder: Secondary | ICD-10-CM

## 2021-08-22 NOTE — Telephone Encounter (Signed)
Spoke to Patient and relayed message per Dr Ancil Boozer ref referral

## 2021-11-21 ENCOUNTER — Ambulatory Visit: Payer: 59 | Admitting: Family Medicine

## 2022-05-29 ENCOUNTER — Encounter: Payer: 59 | Admitting: Family Medicine
